# Patient Record
Sex: Female | Born: 1955
Health system: Southern US, Community
[De-identification: ages and names within clinical notes are randomized; demographics above are authoritative.]

## PROBLEM LIST (undated history)

## (undated) DIAGNOSIS — T7840XA Allergy, unspecified, initial encounter: Secondary | ICD-10-CM

## (undated) DIAGNOSIS — E079 Disorder of thyroid, unspecified: Secondary | ICD-10-CM

## (undated) DIAGNOSIS — E785 Hyperlipidemia, unspecified: Secondary | ICD-10-CM

## (undated) DIAGNOSIS — E559 Vitamin D deficiency, unspecified: Secondary | ICD-10-CM

## (undated) DIAGNOSIS — R7303 Prediabetes: Secondary | ICD-10-CM

## (undated) DIAGNOSIS — E039 Hypothyroidism, unspecified: Secondary | ICD-10-CM

## (undated) DIAGNOSIS — K589 Irritable bowel syndrome without diarrhea: Secondary | ICD-10-CM

## (undated) DIAGNOSIS — E119 Type 2 diabetes mellitus without complications: Secondary | ICD-10-CM

## (undated) HISTORY — PX: FACIAL COSMETIC SURGERY: SHX629

## (undated) HISTORY — DX: Type 2 diabetes mellitus without complications: E11.9

## (undated) HISTORY — PX: BREAST ENHANCEMENT SURGERY: SHX7

## (undated) HISTORY — PX: SHOULDER ARTHROSCOPY: SHX128

## (undated) HISTORY — DX: Allergy, unspecified, initial encounter: T78.40XA

## (undated) HISTORY — PX: TONSILLECTOMY: SUR1361

## (undated) HISTORY — PX: COSMETIC SURGERY: SHX468

## (undated) HISTORY — PX: TUBAL LIGATION: SHX77

---
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
## 1977-01-23 HISTORY — PX: AUGMENTATION MAMMAPLASTY: SUR837

## 1997-06-17 ENCOUNTER — Other Ambulatory Visit: Admission: RE | Admit: 1997-06-17 | Discharge: 1997-06-17 | Payer: Self-pay | Admitting: Obstetrics and Gynecology

## 1997-12-16 ENCOUNTER — Other Ambulatory Visit: Admission: RE | Admit: 1997-12-16 | Discharge: 1997-12-16 | Payer: Self-pay | Admitting: Obstetrics and Gynecology

## 1998-07-14 ENCOUNTER — Other Ambulatory Visit: Admission: RE | Admit: 1998-07-14 | Discharge: 1998-07-14 | Payer: Self-pay | Admitting: Obstetrics and Gynecology

## 1999-08-08 ENCOUNTER — Other Ambulatory Visit: Admission: RE | Admit: 1999-08-08 | Discharge: 1999-08-08 | Payer: Self-pay | Admitting: Obstetrics and Gynecology

## 1999-08-19 ENCOUNTER — Encounter: Payer: Self-pay | Admitting: Internal Medicine

## 1999-08-19 ENCOUNTER — Encounter: Admission: RE | Admit: 1999-08-19 | Discharge: 1999-08-19 | Payer: Self-pay | Admitting: Internal Medicine

## 1999-11-22 ENCOUNTER — Encounter: Admission: RE | Admit: 1999-11-22 | Discharge: 1999-11-22 | Payer: Self-pay | Admitting: Obstetrics and Gynecology

## 1999-11-22 ENCOUNTER — Encounter: Payer: Self-pay | Admitting: Obstetrics and Gynecology

## 2000-09-06 ENCOUNTER — Other Ambulatory Visit: Admission: RE | Admit: 2000-09-06 | Discharge: 2000-09-06 | Payer: Self-pay | Admitting: Obstetrics and Gynecology

## 2000-10-25 ENCOUNTER — Ambulatory Visit (HOSPITAL_COMMUNITY): Admission: RE | Admit: 2000-10-25 | Discharge: 2000-10-25 | Payer: Self-pay | Admitting: Obstetrics and Gynecology

## 2000-10-25 ENCOUNTER — Encounter (INDEPENDENT_AMBULATORY_CARE_PROVIDER_SITE_OTHER): Payer: Self-pay

## 2001-06-12 ENCOUNTER — Encounter: Payer: Self-pay | Admitting: Obstetrics and Gynecology

## 2001-06-12 ENCOUNTER — Encounter: Admission: RE | Admit: 2001-06-12 | Discharge: 2001-06-12 | Payer: Self-pay | Admitting: Obstetrics and Gynecology

## 2001-09-25 ENCOUNTER — Other Ambulatory Visit: Admission: RE | Admit: 2001-09-25 | Discharge: 2001-09-25 | Payer: Self-pay | Admitting: Obstetrics and Gynecology

## 2002-09-23 ENCOUNTER — Encounter: Payer: Self-pay | Admitting: Internal Medicine

## 2002-09-23 ENCOUNTER — Encounter: Admission: RE | Admit: 2002-09-23 | Discharge: 2002-09-23 | Payer: Self-pay | Admitting: Internal Medicine

## 2003-04-09 ENCOUNTER — Other Ambulatory Visit: Admission: RE | Admit: 2003-04-09 | Discharge: 2003-04-09 | Payer: Self-pay | Admitting: Obstetrics and Gynecology

## 2004-04-28 ENCOUNTER — Other Ambulatory Visit: Admission: RE | Admit: 2004-04-28 | Discharge: 2004-04-28 | Payer: Self-pay | Admitting: Obstetrics and Gynecology

## 2005-04-07 ENCOUNTER — Ambulatory Visit: Payer: Self-pay | Admitting: Gastroenterology

## 2005-04-17 ENCOUNTER — Ambulatory Visit: Payer: Self-pay | Admitting: Gastroenterology

## 2005-04-19 ENCOUNTER — Ambulatory Visit: Payer: Self-pay | Admitting: Gastroenterology

## 2005-10-20 ENCOUNTER — Other Ambulatory Visit: Admission: RE | Admit: 2005-10-20 | Discharge: 2005-10-20 | Payer: Self-pay | Admitting: Internal Medicine

## 2005-10-24 ENCOUNTER — Encounter: Admission: RE | Admit: 2005-10-24 | Discharge: 2005-10-24 | Payer: Self-pay | Admitting: Internal Medicine

## 2006-03-07 ENCOUNTER — Encounter: Admission: RE | Admit: 2006-03-07 | Discharge: 2006-03-07 | Payer: Self-pay | Admitting: Internal Medicine

## 2006-07-26 ENCOUNTER — Encounter: Admission: RE | Admit: 2006-07-26 | Discharge: 2006-07-26 | Payer: Self-pay | Admitting: Internal Medicine

## 2007-06-06 ENCOUNTER — Encounter: Admission: RE | Admit: 2007-06-06 | Discharge: 2007-06-06 | Payer: Self-pay | Admitting: Internal Medicine

## 2008-08-20 ENCOUNTER — Encounter: Admission: RE | Admit: 2008-08-20 | Discharge: 2008-08-20 | Payer: Self-pay | Admitting: Internal Medicine

## 2010-01-19 ENCOUNTER — Encounter
Admission: RE | Admit: 2010-01-19 | Discharge: 2010-01-19 | Payer: Self-pay | Source: Home / Self Care | Attending: Internal Medicine | Admitting: Internal Medicine

## 2010-01-21 ENCOUNTER — Encounter: Payer: Self-pay | Admitting: Cardiovascular Disease

## 2010-01-21 ENCOUNTER — Ambulatory Visit: Admission: RE | Admit: 2010-01-21 | Discharge: 2010-01-21 | Payer: Self-pay | Source: Home / Self Care

## 2010-01-21 DIAGNOSIS — I6350 Cerebral infarction due to unspecified occlusion or stenosis of unspecified cerebral artery: Secondary | ICD-10-CM | POA: Insufficient documentation

## 2010-01-28 ENCOUNTER — Ambulatory Visit: Admission: RE | Admit: 2010-01-28 | Discharge: 2010-01-28 | Payer: Self-pay | Source: Home / Self Care

## 2010-01-28 ENCOUNTER — Ambulatory Visit (HOSPITAL_COMMUNITY)
Admission: RE | Admit: 2010-01-28 | Discharge: 2010-01-28 | Payer: Self-pay | Source: Home / Self Care | Attending: Diagnostic Neuroimaging | Admitting: Diagnostic Neuroimaging

## 2010-01-28 ENCOUNTER — Other Ambulatory Visit: Payer: Self-pay | Admitting: Diagnostic Neuroimaging

## 2010-01-31 ENCOUNTER — Encounter: Payer: Self-pay | Admitting: Internal Medicine

## 2010-02-24 NOTE — Miscellaneous (Signed)
Summary: Orders Update  Clinical Lists Changes  Problems: Added new problem of CVA (ICD-434.91) Orders: Added new Test order of Carotid Duplex (Carotid Duplex) - Signed 

## 2010-06-10 NOTE — Op Note (Signed)
[facility]  Patient:    Jennifer Herrera, Jennifer Herrera Visit Number: [id number] MRN: [id number]          Service Type: DSU Location: [facility] Attending Physician:  Frederich Balding Proc. Date: 10/25/00 Admit Date:  10/25/2000                             Operative Report  PREOPERATIVE DIAGNOSIS:       Abnormal uterine bleeding with endometrial polyp.  POSTOPERATIVE DIAGNOSIS:      Abnormal uterine bleeding with endometrial polyp.  OPERATION:                    Cervical dilation.  Hysteroscopy with resection of endometrial polyps and multiple endometrial biopsies.  Intrauterine curettings.  SURGEON:                      Juluis Mire, M.D.  ASSISTANT:  ANESTHESIA:                   MAC with paracervical block.  ESTIMATED BLOOD LOSS:         Minimal.  PACKS AND DRAINS:             None.  BLOOD REPLACED:               None.  COMPLICATIONS:                None.  INDICATIONS:                  Dictated in history and physical.  DESCRIPTION OF PROCEDURE:     The patient was taken to the OR and placed in the supine position.  After slight sedation, she was placed in the dorsal lithotomy position using Allen stirrups.  The patient was draped out for laparoscopy.  A speculum was placed in the vaginal vault.  The cervix and vagina were cleansed with Betadine.  The cervix was grasped with a single tooth tenaculum.  A paracervical block was instituted using 1% Xylocaine. Hysteroscope was then introduced and intrauterine cavity was distended using Sorbitol.  Visualization revealed a polyp that was free.  This was removed. She had a second polyp up in the left uterine fundus that was excised using a resectoscope.  At this point in time multiple endometrial biopsies were obtained with the resectoscope.  No active bleeding or perforation was noted. Endometrial curettings were also obtained and sent for pathology.  At the end of the procedure, there was no active  bleeding and our deficit was 0.  The speculum and single tooth tenaculum then removed.  The patient was taken out of the dorsal lithotomy position and once alert, transferred to the recovery room in good condition. Attending Physician:  Frederich Balding DD:  10/25/00 TD:  10/25/00 Job: [id number]

## 2010-06-10 NOTE — H&P (Signed)
[facility]  Patient:    Jennifer Herrera, Jennifer Herrera Visit Number: [id number] MRN: [id number]          Service Type: Attending:  Juluis Mire, M.D. Dictated by:   Juluis Mire, M.D. Adm. Date:  10/25/00                           History and Physical  REASON FOR ADMISSION:         This 55 year old gravida 2, para 2 married white female presents for hysteroscopy with resectoscope.  HISTORY OF PRESENT ILLNESS:   The patient has a history of menstrual irregularities.  The patient had begun on low-dose birth control pills for management.  She subsequently discontinued these and continued to have abnormal bleeding.  The patient underwent saline infusion ultrasound that revealed endometrial irregularity with possible polyps.  In view of this, she presents for possible hysteroscopic evaluation and resection.  Of note, she did have lab work, which included a normal FHS, thyroid and prolactin.  ALLERGIES:                    Sulfa.  MEDICATIONS:                  She had been on estradiol.  PAST MEDICAL HISTORY:         Usual childhood diseases.  No significant sequelae.  She has had two prior laparoscopies with findings of endometriosis.  Subsequent bilateral tubal ligation in 1993 was negative.  She does have bilateral breast implants.  PAST OBSTETRIC HISTORY:       Two spontaneous vaginal deliveries.  FAMILY HISTORY:               Noncontributory.  SOCIAL HISTORY:               No tobacco or alcohol use.  REVIEW OF SYSTEMS:            Noncontributory.  PHYSICAL EXAMINATION:  VITAL SIGNS:                  Afebrile with stable vital signs.  HEENT:                        Normocephalic.  Pupils equal, round and reactive to light and accommodation.  Extraocular movements intact.  Sclerae and conjunctivae clear.  Oropharynx clear.  NECK:                         No thyromegaly.  BREASTS:                      Bilateral implants were noted.  No  dominant masses.  LUNGS:                        Clear.  CARDIAC:                      ______.  No murmurs or gallops.  ABDOMEN:                      Benign.  PELVIC:                       Normal external genitalia.  The vaginal mucosa is clear.  Cervix unremarkable.  The uterus is normal size,  shape and contour. Adnexa free of masses and tenderness.  EXTREMITIES:                  Trace edema.  NEUROLOGIC:                   Grossly within normal limits.  IMPRESSION:                   Abnormal uterine bleeding with questionable endometrial polyp.  PLAN:                         The patient will undergo a hysteroscopic evaluation with resection.  The risks of surgery have been discussed including the risk of infection.  The risk of vascular injury that could lead to the need for transfusion or hysterectomy.  The risk of uterine perforation that could lead to injury to adjacent organs requiring laparoscopy and possible exploratory laparotomy for management.  The risk of deep venous thrombosis and pulmonary embolus.  Excessive resection can lead to possible pulmonary edema and hyponatremia.  The patient understands the risks and indications. Dictated by:   Juluis Mire, M.D. Attending:  Juluis Mire, M.D. DD:  10/25/00 TD:  10/25/00 Job: [id number]

## 2011-06-27 ENCOUNTER — Other Ambulatory Visit: Payer: Self-pay | Admitting: Internal Medicine

## 2011-06-27 DIAGNOSIS — K7689 Other specified diseases of liver: Secondary | ICD-10-CM

## 2011-06-29 ENCOUNTER — Ambulatory Visit
Admission: RE | Admit: 2011-06-29 | Discharge: 2011-06-29 | Disposition: A | Discharge status: Home / Self Care | Payer: 59 | Source: Ambulatory Visit | Attending: Internal Medicine | Admitting: Internal Medicine

## 2011-06-29 DIAGNOSIS — K7689 Other specified diseases of liver: Secondary | ICD-10-CM

## 2012-10-25 ENCOUNTER — Other Ambulatory Visit: Payer: Self-pay | Admitting: Internal Medicine

## 2012-10-25 DIAGNOSIS — Z1231 Encounter for screening mammogram for malignant neoplasm of breast: Secondary | ICD-10-CM

## 2012-11-27 ENCOUNTER — Ambulatory Visit
Admission: RE | Admit: 2012-11-27 | Discharge: 2012-11-27 | Disposition: A | Discharge status: Home / Self Care | Payer: 59 | Source: Ambulatory Visit | Attending: Internal Medicine | Admitting: Internal Medicine

## 2012-11-27 DIAGNOSIS — Z1231 Encounter for screening mammogram for malignant neoplasm of breast: Secondary | ICD-10-CM

## 2012-11-29 ENCOUNTER — Other Ambulatory Visit: Payer: Self-pay | Admitting: Internal Medicine

## 2012-11-29 DIAGNOSIS — R928 Other abnormal and inconclusive findings on diagnostic imaging of breast: Secondary | ICD-10-CM

## 2012-12-06 ENCOUNTER — Other Ambulatory Visit: Payer: 59

## 2012-12-10 ENCOUNTER — Ambulatory Visit
Admission: RE | Admit: 2012-12-10 | Discharge: 2012-12-10 | Disposition: A | Discharge status: Home / Self Care | Payer: 59 | Source: Ambulatory Visit | Attending: Internal Medicine | Admitting: Internal Medicine

## 2012-12-10 DIAGNOSIS — R928 Other abnormal and inconclusive findings on diagnostic imaging of breast: Secondary | ICD-10-CM

## 2014-04-15 ENCOUNTER — Ambulatory Visit
Admission: RE | Admit: 2014-04-15 | Discharge: 2014-04-15 | Disposition: A | Discharge status: Home / Self Care | Payer: Managed Care, Other (non HMO) | Source: Ambulatory Visit | Attending: Internal Medicine | Admitting: Internal Medicine

## 2014-04-15 ENCOUNTER — Observation Stay (HOSPITAL_COMMUNITY)
Admission: EM | Admit: 2014-04-15 | Discharge: 2014-04-16 | Disposition: A | Discharge status: Home / Self Care | Payer: Managed Care, Other (non HMO) | Attending: General Surgery | Admitting: General Surgery

## 2014-04-15 ENCOUNTER — Other Ambulatory Visit: Payer: Self-pay | Admitting: Internal Medicine

## 2014-04-15 ENCOUNTER — Encounter (HOSPITAL_COMMUNITY): Payer: Self-pay | Admitting: Emergency Medicine

## 2014-04-15 DIAGNOSIS — Z8673 Personal history of transient ischemic attack (TIA), and cerebral infarction without residual deficits: Secondary | ICD-10-CM | POA: Insufficient documentation

## 2014-04-15 DIAGNOSIS — Z79899 Other long term (current) drug therapy: Secondary | ICD-10-CM | POA: Insufficient documentation

## 2014-04-15 DIAGNOSIS — K589 Irritable bowel syndrome without diarrhea: Secondary | ICD-10-CM | POA: Diagnosis not present

## 2014-04-15 DIAGNOSIS — K5909 Other constipation: Secondary | ICD-10-CM | POA: Diagnosis present

## 2014-04-15 DIAGNOSIS — R1031 Right lower quadrant pain: Secondary | ICD-10-CM | POA: Diagnosis not present

## 2014-04-15 DIAGNOSIS — Z882 Allergy status to sulfonamides status: Secondary | ICD-10-CM | POA: Insufficient documentation

## 2014-04-15 DIAGNOSIS — R1084 Generalized abdominal pain: Secondary | ICD-10-CM

## 2014-04-15 DIAGNOSIS — E039 Hypothyroidism, unspecified: Secondary | ICD-10-CM | POA: Insufficient documentation

## 2014-04-15 DIAGNOSIS — Q438 Other specified congenital malformations of intestine: Secondary | ICD-10-CM

## 2014-04-15 DIAGNOSIS — K5900 Constipation, unspecified: Secondary | ICD-10-CM | POA: Insufficient documentation

## 2014-04-15 HISTORY — DX: Disorder of thyroid, unspecified: E07.9

## 2014-04-15 LAB — CBC WITH DIFFERENTIAL/PLATELET
Basophils Absolute: 0 10*3/uL (ref 0.0–0.1)
Basophils Relative: 0 % (ref 0–1)
EOS ABS: 0.1 10*3/uL (ref 0.0–0.7)
EOS PCT: 1 % (ref 0–5)
HEMATOCRIT: 44.4 % (ref 36.0–46.0)
HEMOGLOBIN: 15 g/dL (ref 12.0–15.0)
LYMPHS ABS: 3.2 10*3/uL (ref 0.7–4.0)
Lymphocytes Relative: 37 % (ref 12–46)
MCH: 31.5 pg (ref 26.0–34.0)
MCHC: 33.8 g/dL (ref 30.0–36.0)
MCV: 93.3 fL (ref 78.0–100.0)
MONOS PCT: 7 % (ref 3–12)
Monocytes Absolute: 0.6 10*3/uL (ref 0.1–1.0)
NEUTROS PCT: 55 % (ref 43–77)
Neutro Abs: 4.7 10*3/uL (ref 1.7–7.7)
Platelets: 295 10*3/uL (ref 150–400)
RBC: 4.76 MIL/uL (ref 3.87–5.11)
RDW: 13.4 % (ref 11.5–15.5)
WBC: 8.6 10*3/uL (ref 4.0–10.5)

## 2014-04-15 LAB — URINALYSIS, ROUTINE W REFLEX MICROSCOPIC
Bilirubin Urine: NEGATIVE
Glucose, UA: NEGATIVE mg/dL
HGB URINE DIPSTICK: NEGATIVE
KETONES UR: NEGATIVE mg/dL
LEUKOCYTES UA: NEGATIVE
Nitrite: NEGATIVE
PROTEIN: NEGATIVE mg/dL
Specific Gravity, Urine: 1.022 (ref 1.005–1.030)
UROBILINOGEN UA: 1 mg/dL (ref 0.0–1.0)
pH: 7 (ref 5.0–8.0)

## 2014-04-15 LAB — I-STAT CHEM 8, ED
BUN: 19 mg/dL (ref 6–23)
CALCIUM ION: 1.22 mmol/L (ref 1.12–1.23)
CHLORIDE: 101 mmol/L (ref 96–112)
Creatinine, Ser: 0.8 mg/dL (ref 0.50–1.10)
GLUCOSE: 94 mg/dL (ref 70–99)
HEMATOCRIT: 48 % — AB (ref 36.0–46.0)
Hemoglobin: 16.3 g/dL — ABNORMAL HIGH (ref 12.0–15.0)
Potassium: 3.7 mmol/L (ref 3.5–5.1)
Sodium: 138 mmol/L (ref 135–145)
TCO2: 20 mmol/L (ref 0–100)

## 2014-04-15 MED ORDER — DIPHENHYDRAMINE HCL 50 MG/ML IJ SOLN: 12.5000 mg | Freq: Four times a day (QID) | INTRAMUSCULAR | Status: DC | PRN

## 2014-04-15 MED ORDER — TOPIRAMATE 25 MG PO TABS
50.0000 mg | ORAL_TABLET | Freq: Every day | ORAL | Status: DC
  Administered 2014-04-16: 50 mg via ORAL
  Filled 2014-04-15: qty 2

## 2014-04-15 MED ORDER — DIPHENHYDRAMINE HCL 12.5 MG/5ML PO ELIX: 12.5000 mg | ORAL_SOLUTION | Freq: Four times a day (QID) | ORAL | Status: DC | PRN

## 2014-04-15 MED ORDER — HYDROCODONE-ACETAMINOPHEN 5-325 MG PO TABS: 1.0000 | ORAL_TABLET | ORAL | Status: DC | PRN

## 2014-04-15 MED ORDER — DOCUSATE SODIUM 100 MG PO CAPS
100.0000 mg | ORAL_CAPSULE | Freq: Two times a day (BID) | ORAL | Status: DC
  Administered 2014-04-15 – 2014-04-16 (×2): 100 mg via ORAL
  Filled 2014-04-15 (×2): qty 1

## 2014-04-15 MED ORDER — TOPIRAMATE 25 MG PO TABS: 50.0000 mg | ORAL_TABLET | Freq: Every day | ORAL | Status: DC

## 2014-04-15 MED ORDER — ONDANSETRON HCL 4 MG/2ML IJ SOLN: 4.0000 mg | Freq: Four times a day (QID) | INTRAMUSCULAR | Status: DC | PRN

## 2014-04-15 MED ORDER — PEG 3350-KCL-NA BICARB-NACL 420 G PO SOLR
500.0000 mL | Freq: Three times a day (TID) | ORAL | Status: DC
  Administered 2014-04-15 – 2014-04-16 (×2): 500 mL via ORAL
  Filled 2014-04-15: qty 4000

## 2014-04-15 MED ORDER — LEVOTHYROXINE SODIUM 88 MCG PO TABS
88.0000 ug | ORAL_TABLET | Freq: Every day | ORAL | Status: DC
  Administered 2014-04-16: 88 ug via ORAL
  Filled 2014-04-15: qty 1

## 2014-04-15 MED ORDER — MORPHINE SULFATE 2 MG/ML IJ SOLN: 1.0000 mg | INTRAMUSCULAR | Status: DC | PRN

## 2014-04-15 MED ORDER — IOPAMIDOL (ISOVUE-300) INJECTION 61%
100.0000 mL | Freq: Once | INTRAVENOUS | Status: AC | PRN
  Administered 2014-04-15: 100 mL via INTRAVENOUS

## 2014-04-15 MED ORDER — ACETAMINOPHEN 325 MG PO TABS: 650.0000 mg | ORAL_TABLET | Freq: Four times a day (QID) | ORAL | Status: DC | PRN

## 2014-04-15 MED ORDER — VALACYCLOVIR HCL 500 MG PO TABS
500.0000 mg | ORAL_TABLET | Freq: Two times a day (BID) | ORAL | Status: DC
  Administered 2014-04-15 – 2014-04-16 (×2): 500 mg via ORAL
  Filled 2014-04-15 (×4): qty 1

## 2014-04-15 MED ORDER — LIOTHYRONINE SODIUM 25 MCG PO TABS
25.0000 ug | ORAL_TABLET | Freq: Every day | ORAL | Status: DC
  Administered 2014-04-16: 25 ug via ORAL
  Filled 2014-04-15: qty 1

## 2014-04-15 MED ORDER — ACETAMINOPHEN 650 MG RE SUPP: 650.0000 mg | Freq: Four times a day (QID) | RECTAL | Status: DC | PRN

## 2014-04-15 NOTE — H&P (Addendum)
Jennifer Herrera is an 59 y.o. female.   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 This started out as generalized abdominal pain. She does have irritable bowel syndrome and felt that she just had a lot of gas. She took a lot of Gas-X but this did not relieve her discomfort. She felt like she was "8 months pregnant". She had to hold her abdomen to make it feel better. She did not have nausea or vomiting during this time. She usually has constipation with her IBS but did have some diarrhea approximately 4-5 days ago. She denies fevers and chills.  In the last 24 hours, she has had worsening pain and migration of the pain to the right lower quadrant.  She states that walking made the pain worse. She also felt that sitting when the pain worse. She has had occasional night sweats, but no change from her normal menopause related night sweats.  She reports that the pain is around 6-7 out of 10.  She also had some significant chest pain/heartburn several days ago. This was bad enough that she consider stopping and getting an aspirin.  Past Medical History  Diagnosis Date  . Thyroid disease     Past Surgical History  Procedure Laterality Date  . Breast enhancement surgery    . Shoulder arthroscopy    . Tonsillectomy      No family history on file. Social History:  reports that she has never smoked. She does not have any smokeless tobacco history on file. She reports that she drinks alcohol. She reports that she does not use illicit drugs.  Allergies:  Allergies  Allergen Reactions  . Sulfa Antibiotics Rash   Medications: Medications Hospital Medications (13) Outpatient Medications (8) Clinic-Administered Medications (0)   New medications from outside sources are available for reconciliation  L1 acetaminophen (TYLENOL) suppository 650 mg   L1 acetaminophen (TYLENOL) tablet 650 mg   L2 diphenhydrAMINE  (BENADRYL) 12.5 MG/5ML elixir 12.5-25 mg   L2 diphenhydrAMINE (BENADRYL) injection 12.5-25 mg    docusate sodium (COLACE) capsule 100 mg    HYDROcodone-acetaminophen (NORCO/VICODIN) 5-325 MG per tablet 1-2 tablet    levothyroxine (SYNTHROID, LEVOTHROID) tablet 88 mcg    liothyronine (CYTOMEL) tablet 25 mcg    morphine 2 MG/ML injection 1-2 mg    ondansetron (ZOFRAN) injection 4 mg    polyethylene glycol-electrolytes (NuLYTELY/GoLYTELY) solution 500 mL    topiramate (TOPAMAX) tablet 50 mg    valACYclovir (VALTREX) tablet 500 mg           Results for orders placed or performed during the hospital encounter of 04/15/14 (from the past 48 hour(s))  CBC with Differential     Status: None   Collection Time: 04/15/14  5:25 PM  Result Value Ref Range   WBC 8.6 4.0 - 10.5 K/uL   RBC 4.76 3.87 - 5.11 MIL/uL   Hemoglobin 15.0 12.0 - 15.0 g/dL   HCT 16.1 09.6 - 04.5 %   MCV 93.3 78.0 - 100.0 fL   MCH 31.5 26.0 - 34.0 pg   MCHC 33.8 30.0 - 36.0 g/dL   RDW 40.9 81.1 - 91.4 %   Platelets 295 150 - 400 K/uL   Neutrophils Relative % 55 43 - 77 %   Neutro Abs 4.7 1.7 - 7.7 K/uL   Lymphocytes Relative 37 12 - 46 %   Lymphs Abs 3.2 0.7 - 4.0 K/uL   Monocytes  Relative 7 3 - 12 %   Monocytes Absolute 0.6 0.1 - 1.0 K/uL   Eosinophils Relative 1 0 - 5 %   Eosinophils Absolute 0.1 0.0 - 0.7 K/uL   Basophils Relative 0 0 - 1 %   Basophils Absolute 0.0 0.0 - 0.1 K/uL  I-Stat Chem 8, ED     Status: Abnormal   Collection Time: 04/15/14  5:34 PM  Result Value Ref Range   Sodium 138 135 - 145 mmol/L   Potassium 3.7 3.5 - 5.1 mmol/L   Chloride 101 96 - 112 mmol/L   BUN 19 6 - 23 mg/dL   Creatinine, Ser 4.09 0.50 - 1.10 mg/dL   Glucose, Bld 94 70 - 99 mg/dL   Calcium, Ion 8.11 9.14 - 1.23 mmol/L   TCO2 20 0 - 100 mmol/L   Hemoglobin 16.3 (H) 12.0 - 15.0 g/dL   HCT 78.2 (H) 95.6 - 21.3 %  Urinalysis, Routine w reflex microscopic     Status: None   Collection Time: 04/15/14  7:36 PM    Result Value Ref Range   Color, Urine YELLOW YELLOW   APPearance CLEAR CLEAR   Specific Gravity, Urine 1.022 1.005 - 1.030   pH 7.0 5.0 - 8.0   Glucose, UA NEGATIVE NEGATIVE mg/dL   Hgb urine dipstick NEGATIVE NEGATIVE   Bilirubin Urine NEGATIVE NEGATIVE   Ketones, ur NEGATIVE NEGATIVE mg/dL   Protein, ur NEGATIVE NEGATIVE mg/dL   Urobilinogen, UA 1.0 0.0 - 1.0 mg/dL   Nitrite NEGATIVE NEGATIVE   Leukocytes, UA NEGATIVE NEGATIVE    Comment: MICROSCOPIC NOT DONE ON URINES WITH NEGATIVE PROTEIN, BLOOD, LEUKOCYTES, NITRITE, OR GLUCOSE <1000 mg/dL.   Ct Abdomen Pelvis W Contrast  04/15/2014   CLINICAL DATA:  Right lower quadrant pain for 3 days, diarrhea, history of irritable bowel syndrome  EXAM: CT ABDOMEN AND PELVIS WITH CONTRAST  TECHNIQUE: Multidetector CT imaging of the abdomen and pelvis was performed using the standard protocol following bolus administration of intravenous contrast.  CONTRAST:  100 cc Isovue-300  COMPARISON:  Ultrasound of the abdomen of 06/29/2011  FINDINGS: The lung bases are clear. Calcified bilateral breast implants are noted. The liver enhances with no focal abnormality and no ductal dilatation is seen. The gallbladder is not distended and no calcified gallstones are seen. The pancreas is normal in size and the pancreatic duct is not dilated. The adrenal glands and spleen are unremarkable. The stomach is moderately fluid distended. There is some thickening of the mucosa of the distal antrum -duodenal bulb of questionable significance. The kidneys enhance and no renal calculi are seen. There is no evidence of hydronephrosis. The abdominal aorta is normal in caliber. No adenopathy is seen.  The appendix is visualized retrocecal in position within the right lower quadrant -right upper pelvis. The proximal portion of the appendix appears normal. However the distal portion appears somewhat edematous although there is air within the lumen. Within the distal portion of the  appendix, the diameter is approximately 10 mm which is abnormal. Developing appendicitis cannot be excluded. The terminal ileum is unremarkable.  The urinary bladder is moderately distended with no abnormality noted. The uterus is normal in size. No adnexal lesion is seen, and no free fluid is seen within the pelvis. No abnormality of the colon is seen. The lumbar vertebrae are in normal alignment. There is mild degenerative disc disease at L5-S1.  IMPRESSION: 1. Somewhat thick-walled appendix near the tip measuring up to 10 mm in diameter although there  is air present within the lumen. Developing acute appendicitis cannot be excluded. 2. Somewhat thickened mucosa of the distal antrum of the stomach -duodenal bulb of uncertain significance. This may well be normal but clinical correlation is recommended. 3. Mild degenerative disc disease at L5-S1.   Electronically Signed   By: Dwyane DeePaul  Barry M.D.   On: 04/15/2014 16:19    Review of Systems  Constitutional: Negative for fever, chills and malaise/fatigue.  HENT: Negative.   Eyes: Negative.   Respiratory: Negative.   Cardiovascular: Positive for chest pain.  Gastrointestinal: Positive for heartburn (2 days ago.  ), abdominal pain, diarrhea and constipation. Negative for blood in stool.  Genitourinary: Negative.   Musculoskeletal: Negative.   Skin: Negative.   Endo/Heme/Allergies: Negative.   Psychiatric/Behavioral: Negative.   All other systems reviewed and are negative.   Blood pressure 105/60, pulse 70, temperature 98.2 F (36.8 C), temperature source Oral, resp. rate 16, height 5' 5.98" (1.676 m), weight 69.99 kg (154 lb 4.8 oz), SpO2 96 %. Physical Exam  Constitutional: She is oriented to person, place, and time. She appears well-developed and well-nourished. No distress.  HENT:  Head: Normocephalic and atraumatic.  Eyes: Conjunctivae are normal. Pupils are equal, round, and reactive to light. Right eye exhibits no discharge. Left eye exhibits  no discharge. No scleral icterus.  Neck: Normal range of motion. Neck supple.  Cardiovascular: Normal rate.   Respiratory: Effort normal. No respiratory distress.  GI: Soft. She exhibits no distension. There is no tenderness. There is no rebound.  Musculoskeletal: Normal range of motion. She exhibits no edema or tenderness.  Neurological: She is alert and oriented to person, place, and time.  Skin: Skin is warm and dry. No rash noted. She is not diaphoretic. No erythema. No pallor.  Psychiatric: She has a normal mood and affect. Her behavior is normal. Judgment and thought content normal.     Assessment/Plan Right lower quadrant pain.  The patient does have slight dilation of her appendiceal tip on CT scan. However, she does not have the other signs and symptoms of appendicitis. There is air in the tip of the appendix as well as no surrounding stranding. She does not have right lower quadrant tenderness or guarding, leukocytosis, left shift, or fevers. With 48 hours of pain, I would expect to see some more positive signs of appendicitis.  I do think she looks like she is quite constipated based on the appearance of stool all the way to her right colon. She has an extremely redundant transverse colon. I think this is likely the cause of her pain. However, given the fact that she describes the pain as different from her previous IBS flares, I do think it's reasonable to admit her and reexamine her in the morning. I'll communicate this with Dr. Derrell Lollingamirez. Art Levan 04/15/2014, 10:39 PM

## 2014-04-15 NOTE — ED Provider Notes (Signed)
CSN: [id number]     Arrival date & time 04/15/14  1654 History   First MD Initiated Contact with Patient 04/15/14 1802     Chief Complaint  Patient presents with  . Abdominal Pain     (Consider location/radiation/quality/duration/timing/severity/associated sxs/prior Treatment) HPI Comments: Jennifer Herrera is a 59 y.o. female with a PMHx of hypothyroidism and IBS, who presents to the ED with complaints of gradual onset abdominal pain 3 days. She reports that the pain began as a dull generalized pain gradually localized into the right lower quadrant. She saw Dr. Eula Listen at [facility], who sent her for a CT scan which showed early appendicitis. She states the pain is 3/10 at this time, constant, waxing and waning in severity, dull and intermittently sharp, nonradiating, worse with movement or walking, and improved with laying flat stretched out on her back, and staying still. She also reports some relief with ibuprofen. She reports that she has had some constipation since Saturday, stating that this is normal for her with her IBS. She denies any fevers, chills, chest pain, shortness of breath, nausea, vomiting, diarrhea, constipation, rectal pain or bleeding, dysuria, hematuria, vaginal bleeding or discharge, arthralgias, myalgias, rashes, numbness, tingling, weakness. Denies any sick contacts, recent travel, suspicious food intake, or alcohol use. Last meal was a protein bar 12:30 PM. Patient is allergic to sulfa medications.  Patient is a 59 y.o. female presenting with abdominal pain. The history is provided by the patient. No language interpreter was used.  Abdominal Pain Pain location:  RLQ Pain quality: dull and sharp   Pain radiates to:  Does not radiate Pain severity:  Moderate Onset quality:  Gradual Duration:  3 days Timing:  Constant Progression:  Worsening Chronicity:  New Context: not recent travel, not sick contacts, not suspicious food intake and not trauma   Relieved  by:  Lying down (staying still and laying flat, stretched out) Worsened by:  Movement and NSAIDs Ineffective treatments:  None tried Associated symptoms: constipation (normal for her)   Associated symptoms: no belching, no chest pain, no chills, no diarrhea, no dysuria, no fever, no flatus, no hematemesis, no hematochezia, no hematuria, no melena, no nausea, no shortness of breath, no vaginal bleeding, no vaginal discharge and no vomiting     Past Medical History  Diagnosis Date  . Thyroid disease    Past Surgical History  Procedure Laterality Date  . Breast enhancement surgery    . Shoulder arthroscopy    . Tonsillectomy     No family history on file. History  Substance Use Topics  . Smoking status: Never Smoker   . Smokeless tobacco: Not on file  . Alcohol Use: Yes     Comment: once weekly   OB History    No data available     Review of Systems  Constitutional: Negative for fever and chills.  Respiratory: Negative for shortness of breath.   Cardiovascular: Negative for chest pain.  Gastrointestinal: Positive for abdominal pain and constipation (normal for her). Negative for nausea, vomiting, diarrhea, melena, hematochezia, rectal pain, flatus and hematemesis.  Genitourinary: Negative for dysuria, hematuria, flank pain, vaginal bleeding and vaginal discharge.  Musculoskeletal: Negative for myalgias and arthralgias.  Skin: Negative for rash.  Allergic/Immunologic: Negative for immunocompromised state.  Neurological: Negative for weakness and numbness.  Psychiatric/Behavioral: Negative for confusion.   10 Systems reviewed and are negative for acute change except as noted in the HPI.    Allergies  Sulfa antibiotics  Home Medications  Prior to Admission medications   Medication Sig Start Date End Date Taking? Authorizing Provider  Cholecalciferol (VITAMIN D) 2000 UNITS CAPS Take 1 capsule by mouth daily.   Yes Historical Provider, MD  cyanocobalamin (,VITAMIN B-12,)  1000 MCG/ML injection Inject 1 mL into the muscle once a week. 03/31/14  Yes Historical Provider, MD  Hypromellose (ARTIFICIAL TEARS OP) Place 1 drop into both eyes daily as needed (dry eyes).   Yes Historical Provider, MD  levothyroxine (SYNTHROID, LEVOTHROID) 88 MCG tablet Take 88 mcg by mouth daily before breakfast.   Yes Historical Provider, MD  liothyronine (CYTOMEL) 25 MCG tablet Take 25 mcg by mouth daily.   Yes Historical Provider, MD  topiramate (TOPAMAX) 50 MG tablet Take 50 mg by mouth daily.   Yes Historical Provider, MD  valACYclovir (VALTREX) 500 MG tablet Take 500 mg by mouth 2 (two) times daily. 01/14/14  Yes Historical Provider, MD  VITAMIN A PO Take 1 tablet by mouth daily.   Yes Historical Provider, MD   BP 129/76 mmHg  Pulse 78  Temp(Src) 98.2 F (36.8 C) (Oral)  Resp 18  Ht  (1.676 m)  Wt 150 lb (68.04 kg)  BMI 24.22 kg/m2  SpO2 100% Physical Exam  Constitutional: She is oriented to person, place, and time. Vital signs are normal. She appears well-developed and well-nourished.  Non-toxic appearance. No distress.  Afebrile, nontoxic, NAD  HENT:  Head: Normocephalic and atraumatic.  Mouth/Throat: Oropharynx is clear and moist and mucous membranes are normal.  Eyes: Conjunctivae and EOM are normal. Right eye exhibits no discharge. Left eye exhibits no discharge.  Neck: Normal range of motion. Neck supple.  Cardiovascular: Normal rate, regular rhythm, normal heart sounds and intact distal pulses.  Exam reveals no gallop and no friction rub.   No murmur heard. Pulmonary/Chest: Effort normal and breath sounds normal. No respiratory distress. She has no decreased breath sounds. She has no wheezes. She has no rhonchi. She has no rales.  Abdominal: Soft. Normal appearance and bowel sounds are normal. She exhibits no distension. There is tenderness in the right lower quadrant. There is tenderness at McBurney's point. There is no rigidity, no rebound, no guarding, no CVA  tenderness and negative Murphy's sign.    Soft, nondistended, +BS throughout, with RLQ TTP, no r/g/r, neg murphy's, +mcburney's, no CVA TTP, +foot tap test, neg psoas sign  Musculoskeletal: Normal range of motion.  Neurological: She is alert and oriented to person, place, and time. She has normal strength. No sensory deficit.  Skin: Skin is warm, dry and intact. No rash noted.  Psychiatric: She has a normal mood and affect.  Nursing note and vitals reviewed.   ED Course  Procedures (including critical care time) Labs Review Labs Reviewed  I-STAT CHEM 8, ED - Abnormal; Notable for the following:    Hemoglobin 16.3 (*)    HCT 48.0 (*)    All other components within normal limits  CBC WITH DIFFERENTIAL/PLATELET    Imaging Review Ct Abdomen Pelvis W Contrast  04/15/2014   CLINICAL DATA:  Right lower quadrant pain for 3 days, diarrhea, history of irritable bowel syndrome  EXAM: CT ABDOMEN AND PELVIS WITH CONTRAST  TECHNIQUE: Multidetector CT imaging of the abdomen and pelvis was performed using the standard protocol following bolus administration of intravenous contrast.  CONTRAST:  100 cc Isovue-300  COMPARISON:  Ultrasound of the abdomen of 06/29/2011  FINDINGS: The lung bases are clear. Calcified bilateral breast implants are noted. The liver enhances with  no focal abnormality and no ductal dilatation is seen. The gallbladder is not distended and no calcified gallstones are seen. The pancreas is normal in size and the pancreatic duct is not dilated. The adrenal glands and spleen are unremarkable. The stomach is moderately fluid distended. There is some thickening of the mucosa of the distal antrum -duodenal bulb of questionable significance. The kidneys enhance and no renal calculi are seen. There is no evidence of hydronephrosis. The abdominal aorta is normal in caliber. No adenopathy is seen.  The appendix is visualized retrocecal in position within the right lower quadrant -right upper  pelvis. The proximal portion of the appendix appears normal. However the distal portion appears somewhat edematous although there is air within the lumen. Within the distal portion of the appendix, the diameter is approximately 10 mm which is abnormal. Developing appendicitis cannot be excluded. The terminal ileum is unremarkable.  The urinary bladder is moderately distended with no abnormality noted. The uterus is normal in size. No adnexal lesion is seen, and no free fluid is seen within the pelvis. No abnormality of the colon is seen. The lumbar vertebrae are in normal alignment. There is mild degenerative disc disease at L5-S1.  IMPRESSION: 1. Somewhat thick-walled appendix near the tip measuring up to 10 mm in diameter although there is air present within the lumen. Developing acute appendicitis cannot be excluded. 2. Somewhat thickened mucosa of the distal antrum of the stomach -duodenal bulb of uncertain significance. This may well be normal but clinical correlation is recommended. 3. Mild degenerative disc disease at L5-S1.   Electronically Signed   By: Dwyane DeePaul  Barry M.D.   On: 04/15/2014 16:19     EKG Interpretation None      MDM   Final diagnoses:  Right lower quadrant pain    59 y.o. female here with RLQ pain x3 days, initially generalized then localized to RLQ. On exam, McBurney's point tenderness, +foot tap test. CT obtained from her PCP showing thick-walled appendix which could indicate early appendicitis. Labs obtained here WNL. Pt declines pain meds at this time. Will consult [facility] for surgical management of likely appendicitis.  6:31 PM Dr. Donell BeersByerly returning page, will evaluate pt.   8:44 PM Dr. Donell BeersByerly here to see pt, will admit. Please see her note for further documentation of care. Of note, U/A clear.  BP 122/99 mmHg  Pulse 71  Temp(Src) 98.2 F (36.8 C) (Oral)  Resp 16  Ht 5\' 6"  (1.676 m)  Wt 150 lb (68.04 kg)  BMI 24.22 kg/m2  SpO2 100%     Dyamon Sosinski Camprubi-Soms,  PA-C 04/15/14 2109  Arby BarretteMarcy Pfeiffer, MD 04/15/14 2142

## 2014-04-15 NOTE — Progress Notes (Signed)
Received patient from ED and admitted to room 14.  Patient is AOx4, ambulatory, VS stable, and slight pain at RLQ but tolerable.

## 2014-04-15 NOTE — ED Notes (Signed)
Attempted report 

## 2014-04-15 NOTE — ED Notes (Signed)
Pt sent to ED ref positive CT scan for appendicitis.  Pt c/o abd pain x's 3 days

## 2014-04-16 DIAGNOSIS — Q438 Other specified congenital malformations of intestine: Secondary | ICD-10-CM

## 2014-04-16 DIAGNOSIS — K5909 Other constipation: Secondary | ICD-10-CM | POA: Diagnosis present

## 2014-04-16 LAB — BASIC METABOLIC PANEL
Anion gap: 9 (ref 5–15)
BUN: 16 mg/dL (ref 6–23)
CALCIUM: 9.5 mg/dL (ref 8.4–10.5)
CO2: 23 mmol/L (ref 19–32)
Chloride: 107 mmol/L (ref 96–112)
Creatinine, Ser: 0.93 mg/dL (ref 0.50–1.10)
GFR calc Af Amer: 76 mL/min — ABNORMAL LOW (ref >90)
GFR calc non Af Amer: 66 mL/min — ABNORMAL LOW (ref >90)
GLUCOSE: 97 mg/dL (ref 70–99)
Potassium: 3.9 mmol/L (ref 3.5–5.1)
SODIUM: 139 mmol/L (ref 135–145)

## 2014-04-16 LAB — CBC
HCT: 42.2 % (ref 36.0–46.0)
Hemoglobin: 14.2 g/dL (ref 12.0–15.0)
MCH: 31.3 pg (ref 26.0–34.0)
MCHC: 33.6 g/dL (ref 30.0–36.0)
MCV: 93.2 fL (ref 78.0–100.0)
PLATELETS: 267 10*3/uL (ref 150–400)
RBC: 4.53 MIL/uL (ref 3.87–5.11)
RDW: 13.6 % (ref 11.5–15.5)
WBC: 6.1 10*3/uL (ref 4.0–10.5)

## 2014-04-16 MED ORDER — DOCUSATE SODIUM 100 MG PO CAPS: 100.0000 mg | ORAL_CAPSULE | Freq: Two times a day (BID) | ORAL | Status: DC

## 2014-04-16 MED ORDER — CHLORHEXIDINE GLUCONATE 0.12 % MT SOLN
15.0000 mL | Freq: Two times a day (BID) | OROMUCOSAL | Status: DC
  Administered 2014-04-16: 15 mL via OROMUCOSAL
  Filled 2014-04-16: qty 15

## 2014-04-16 MED ORDER — BISACODYL 10 MG RE SUPP
10.0000 mg | Freq: Once | RECTAL | Status: AC
  Administered 2014-04-16: 10 mg via RECTAL
  Filled 2014-04-16: qty 1

## 2014-04-16 MED ORDER — BISACODYL 10 MG RE SUPP: 10.0000 mg | Freq: Every day | RECTAL | Status: DC | PRN

## 2014-04-16 MED ORDER — CETYLPYRIDINIUM CHLORIDE 0.05 % MT LIQD
7.0000 mL | Freq: Two times a day (BID) | OROMUCOSAL | Status: DC
  Administered 2014-04-16: 7 mL via OROMUCOSAL

## 2014-04-16 MED ORDER — POLYETHYLENE GLYCOL 3350 17 GM/SCOOP PO POWD
8.5000 g | Freq: Two times a day (BID) | ORAL | Status: DC
Start: 2014-04-16 — End: 2014-09-19

## 2014-04-16 NOTE — Discharge Instructions (Signed)
Fecal Impaction °A fecal impaction happens when there is a large, firm amount of stool (or feces) that cannot be passed. The impacted stool is usually in the rectum, which is the lowest part of the large bowel. The impacted stool can block the colon and cause significant problems. °CAUSES  °The longer stool stays in the rectum, the harder it gets. Anything that slows down your bowel movements can lead to fecal impaction, such as: °· Constipation. This can be a long-standing (chronic) problem or can happen suddenly (acute). °· Painful conditions of the rectum, such as hemorrhoids or anal fissures. The pain of these conditions can make you try to avoid having bowel movements. °· Narcotic pain-relieving medicines, such as methadone, morphine, or codeine. °· Not drinking enough fluids. °· Inactivity and bed rest over long periods of time. °· Diseases of the brain or nervous system that damage the nerves controlling the muscles of the intestines. °SIGNS AND SYMPTOMS  °· Lack of normal bowel movements or changes in bowel patterns. °· Sense of fullness in the rectum but unable to pass stool. °· Pain or cramps in the abdominal area (often after meals). °· Thin, watery discharge from the rectum. °DIAGNOSIS  °Your health care provider may suspect that you have a fecal impaction based on your symptoms and a physical exam. This will include an exam of your rectum. Sometimes X-rays or lab testing may be needed to confirm the diagnosis and to be sure there are no other problems.  °TREATMENT  °· Initially an impaction can be removed manually. Using a gloved finger, your health care provider can remove hard stool from your rectum. °· Medicine is sometimes needed. A suppository or enema can be given in the rectum to soften the stool, which can stimulate a bowel movement. Medicines can also be given by mouth (orally). °· Though rare, surgery may be needed if the colon has torn (perforated) due to blockage. °HOME CARE INSTRUCTIONS   °· Develop regular bowel habits. This could include getting in the habit of having a bowel movement after your morning cup of coffee or after eating. Be sure to allow yourself enough time on the toilet. °· Maintain a high-fiber diet. °· Drink enough fluids to keep your urine clear or pale yellow as directed by your health care provider. °· Exercise regularly. °· If you begin to get constipated, increase the amount of fiber in your diet. Eat plenty of fruits, vegetables, whole wheat breads, bran, oatmeal, and similar products. °· Take natural fiber laxatives or other laxatives only as directed by your health care provider. °SEEK MEDICAL CARE IF:  °· You have ongoing rectal pain. °· You require enemas or suppositories more than twice a week. °· You have rectal bleeding. °· You have continued problems, or you develop abdominal pain. °· You have thin, pencil-like stools. °SEEK IMMEDIATE MEDICAL CARE IF:  °You have black or tarry stools. °MAKE SURE YOU:  °· Understand these instructions. °· Will watch your condition. °· Will get help right away if you are not doing well or get worse. °Document Released: 10/02/2003 Document Revised: 10/30/2012 Document Reviewed: 07/16/2012 °ExitCare® Patient Information ©2015 [employer]. This information is not intended to replace advice given to you by your health care provider. Make sure you discuss any questions you have with your health care provider. ° °

## 2014-04-16 NOTE — Discharge Summary (Signed)
[facility] Discharge Summary   Patient ID: Jennifer DollyKaren H Herrera MRN: [id number] DOB/AGE: 60/01/1955 60 y.o.  Admit date: 04/15/2014 Discharge date: 04/16/2014  Admitting Diagnosis: Chronic constipation Right lower quadrant pain Redundant transverse colon  Discharge Diagnosis Patient Active Problem List   Diagnosis Date Noted  . Chronic constipation 04/16/2014  . Redundant colon 04/16/2014  . Right lower quadrant pain 04/15/2014  . CVA 01/21/2010    Consultants None  Imaging: Ct Abdomen Pelvis W Contrast  04/15/2014   CLINICAL DATA:  Right lower quadrant pain for 3 days, diarrhea, history of irritable bowel syndrome  EXAM: CT ABDOMEN AND PELVIS WITH CONTRAST  TECHNIQUE: Multidetector CT imaging of the abdomen and pelvis was performed using the standard protocol following bolus administration of intravenous contrast.  CONTRAST:  100 cc Isovue-300  COMPARISON:  Ultrasound of the abdomen of 06/29/2011  FINDINGS: The lung bases are clear. Calcified bilateral breast implants are noted. The liver enhances with no focal abnormality and no ductal dilatation is seen. The gallbladder is not distended and no calcified gallstones are seen. The pancreas is normal in size and the pancreatic duct is not dilated. The adrenal glands and spleen are unremarkable. The stomach is moderately fluid distended. There is some thickening of the mucosa of the distal antrum -duodenal bulb of questionable significance. The kidneys enhance and no renal calculi are seen. There is no evidence of hydronephrosis. The abdominal aorta is normal in caliber. No adenopathy is seen.  The appendix is visualized retrocecal in position within the right lower quadrant -right upper pelvis. The proximal portion of the appendix appears normal. However the distal portion appears somewhat edematous although there is air within the lumen. Within the distal portion of the appendix, the diameter is approximately 10 mm which is  abnormal. Developing appendicitis cannot be excluded. The terminal ileum is unremarkable.  The urinary bladder is moderately distended with no abnormality noted. The uterus is normal in size. No adnexal lesion is seen, and no free fluid is seen within the pelvis. No abnormality of the colon is seen. The lumbar vertebrae are in normal alignment. There is mild degenerative disc disease at L5-S1.  IMPRESSION: 1. Somewhat thick-walled appendix near the tip measuring up to 10 mm in diameter although there is air present within the lumen. Developing acute appendicitis cannot be excluded. 2. Somewhat thickened mucosa of the distal antrum of the stomach -duodenal bulb of uncertain significance. This may well be normal but clinical correlation is recommended. 3. Mild degenerative disc disease at L5-S1.   Electronically Signed   By: Dwyane DeePaul  Barry M.D.   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 This started out as generalized abdominal pain. She does have irritable bowel syndrome and felt that she just had a lot of gas. She took a lot of Gas-X but this did not relieve her discomfort. She felt like she was "8 months pregnant". She had to hold her abdomen to make it feel better. She did not have nausea or vomiting during this time. She usually has constipation with her IBS but did have some diarrhea approximately 4-5 days ago. She denies fevers and chills. In the last 24 hours, she has had worsening pain and migration of the pain to the right lower quadrant. She states that walking made the pain worse. She also felt that sitting when the pain worse. She has had occasional night sweats, but  no change from her normal menopause related night sweats. She reports that the pain is around 6-7 out of 10.  She also had some significant chest pain/heartburn several days ago. This was bad enough that she consider stopping and getting an  aspirin.  Of note the patient has had chronic constipation since at least her teenage years.    Workup showed constipation and possible concern for tip appendicitis.  It was thought very unlikely that her pain was due to appendicitis.  Anatomically she has a very redundant transverse colon which was full of stool, this could be leading to her constipation.  Her vitals and labs remained normal.  Patient was admitted and was transferred to the floor.  She was started on go-lytely and received 4 doses.  She has had several BM's and is feeling much better.  Diet was advanced as tolerated.  On HD #2 the patient was voiding well, tolerating diet, ambulating well, pain well controlled, vital signs stable,and felt stable for discharge home.  Patient will follow up in our office as needed and knows to call with questions or concerns.  She should follow up with her PCP regarding her chronic constipation.  May eventually need GI referral.  We discussed increasing her physical activity, increased water intake, watching diet as well as an aggressive bowel regimen.  Her goal would be to have a BM 3x/week instead of 1x/week.        Medication List    TAKE these medications        ARTIFICIAL TEARS OP  Place 1 drop into both eyes daily as needed (dry eyes).     bisacodyl 10 MG suppository  Commonly known as:  DULCOLAX  Place 1 suppository (10 mg total) rectally daily as needed for mild constipation or moderate constipation.     cyanocobalamin 1000 MCG/ML injection  Commonly known as:  (VITAMIN B-12)  Inject 1 mL into the muscle once a week.     docusate sodium 100 MG capsule  Commonly known as:  COLACE  Take 1 capsule (100 mg total) by mouth 2 (two) times daily.     levothyroxine 88 MCG tablet  Commonly known as:  SYNTHROID, LEVOTHROID  Take 88 mcg by mouth daily before breakfast.     liothyronine 25 MCG tablet  Commonly known as:  CYTOMEL  Take 25 mcg by mouth daily.     polyethylene glycol powder  powder  Commonly known as:  MIRALAX  Take 8.5-34 g by mouth 2 (two) times daily. To correct constipation.  Adjust dose over 1-2 months.  Goal = ~3 bowel movement / week     topiramate 50 MG tablet  Commonly known as:  TOPAMAX  Take 50 mg by mouth daily.     valACYclovir 500 MG tablet  Commonly known as:  VALTREX  Take 500 mg by mouth 2 (two) times daily.     VITAMIN A PO  Take 1 tablet by mouth daily.     Vitamin D 2000 UNITS Caps  Take 1 capsule by mouth daily.         Follow-up Information    Follow up with Katy Apo, MD.   Specialty:  Internal Medicine   Why:  As needed   Contact information:   [address] [place] [address] [phone]       Signed: Aris [place], [facility] [phone]  04/16/2014, 2:26 PM

## 2014-04-16 NOTE — Progress Notes (Signed)
[facility] Progress Note     Subjective: Pt feels some better today.  No BM, but some flatus.  Abdomen is soft, but she feels bloated.  Ambulating well.  Says she's had trouble with constipation for years and has tried various things without good success.    Objective: Vital signs in last 24 hours: Temp:  [97.9 F (36.6 C)-98.2 F (36.8 C)] 97.9 F (36.6 C) (03/24 0510) Pulse Rate:  [63-78] 63 (03/24 0510) Resp:  [16-18] 16 (03/24 0510) BP: (91-137)/(58-99) 91/62 mmHg (03/24 0510) SpO2:  [96 %-100 %] 98 % (03/24 0510) Weight:  [68.04 kg (150 lb)-69.99 kg (154 lb 4.8 oz)] 69.99 kg (154 lb 4.8 oz) (03/23 2215) Last BM Date: 04/10/14  Intake/Output from previous day: 03/23 0701 - 03/24 0700 In: 500 [P.O.:500] Out: -  Intake/Output this shift:    PE: Gen:  Alert, NAD, pleasant Abd: Soft, ND, tender in RLQ mildly, +BS, no HSM   Lab Results:   Recent Labs  04/15/14 1725 04/15/14 1734 04/16/14 0516  WBC 8.6  --  6.1  HGB 15.0 16.3* 14.2  HCT 44.4 48.0* 42.2  PLT 295  --  267   BMET  Recent Labs  04/15/14 1734 04/16/14 0516  NA 138 139  K 3.7 3.9  CL 101 107  CO2  --  23  GLUCOSE 94 97  BUN 19 16  CREATININE 0.80 0.93  CALCIUM  --  9.5   PT/INR No results for input(s): LABPROT, INR in the last 72 hours. CMP     Component Value Date/Time   NA 139 04/16/2014 0516   K 3.9 04/16/2014 0516   CL 107 04/16/2014 0516   CO2 23 04/16/2014 0516   GLUCOSE 97 04/16/2014 0516   BUN 16 04/16/2014 0516   CREATININE 0.93 04/16/2014 0516   CALCIUM 9.5 04/16/2014 0516   GFRNONAA 66* 04/16/2014 0516   GFRAA 76* 04/16/2014 0516   Lipase  No results found for: LIPASE     Studies/Results: Ct Abdomen Pelvis W Contrast  04/15/2014   CLINICAL DATA:  Right lower quadrant pain for 3 days, diarrhea, history of irritable bowel syndrome  EXAM: CT ABDOMEN AND PELVIS WITH CONTRAST  TECHNIQUE: Multidetector CT imaging of the abdomen and pelvis was performed  using the standard protocol following bolus administration of intravenous contrast.  CONTRAST:  100 cc Isovue-300  COMPARISON:  Ultrasound of the abdomen of 06/29/2011  FINDINGS: The lung bases are clear. Calcified bilateral breast implants are noted. The liver enhances with no focal abnormality and no ductal dilatation is seen. The gallbladder is not distended and no calcified gallstones are seen. The pancreas is normal in size and the pancreatic duct is not dilated. The adrenal glands and spleen are unremarkable. The stomach is moderately fluid distended. There is some thickening of the mucosa of the distal antrum -duodenal bulb of questionable significance. The kidneys enhance and no renal calculi are seen. There is no evidence of hydronephrosis. The abdominal aorta is normal in caliber. No adenopathy is seen.  The appendix is visualized retrocecal in position within the right lower quadrant -right upper pelvis. The proximal portion of the appendix appears normal. However the distal portion appears somewhat edematous although there is air within the lumen. Within the distal portion of the appendix, the diameter is approximately 10 mm which is abnormal. Developing appendicitis cannot be excluded. The terminal ileum is unremarkable.  The urinary bladder is moderately distended with no abnormality noted. The uterus is normal  in size. No adnexal lesion is seen, and no free fluid is seen within the pelvis. No abnormality of the colon is seen. The lumbar vertebrae are in normal alignment. There is mild degenerative disc disease at L5-S1.  IMPRESSION: 1. Somewhat thick-walled appendix near the tip measuring up to 10 mm in diameter although there is air present within the lumen. Developing acute appendicitis cannot be excluded. 2. Somewhat thickened mucosa of the distal antrum of the stomach -duodenal bulb of uncertain significance. This may well be normal but clinical correlation is recommended. 3. Mild degenerative  disc disease at L5-S1.   Electronically Signed   By: Dwyane Dee M.D.   On: 04/15/2014 16:19    Anti-infectives: Anti-infectives    Start     Dose/Rate Route Frequency Ordered Stop   04/15/14 2300  valACYclovir (VALTREX) tablet 500 mg     500 mg Oral 2 times daily 04/15/14 2206         Assessment/Plan Right lower quadrant pain IBS with chronic constipation Redundant transverse colon -Labs are normal, imaging was concerned for tip appendicitis, but patient is improved today and her labs and vitals go against appendicitis.  Her exam is benign.   -Allow clear liquids -Continue bowel regimen -We had a long discussion about taking a stool softener, miralax daily and increasing water intake, activity, and adjusting diet to allow for better BM's. -No need for operation -If tolerating diet can likely d/c home today to continue with bowel regimen.  May need referral to dietitian or GI if still continues to have problems with constipation long term.      DORT, Ammy Lienhard 04/16/2014, 8:02 AM Pager: [phone]

## 2014-04-16 NOTE — Discharge Planning (Signed)
Patient discharged home in stable condition. Verbalizes understanding of all discharge instructions, including home medications and follow up appointments. 

## 2014-04-16 NOTE — Progress Notes (Signed)
UR completed 

## 2014-04-30 ENCOUNTER — Other Ambulatory Visit: Payer: Self-pay | Admitting: Obstetrics and Gynecology

## 2014-04-30 DIAGNOSIS — Z1231 Encounter for screening mammogram for malignant neoplasm of breast: Secondary | ICD-10-CM

## 2014-05-05 ENCOUNTER — Ambulatory Visit: Payer: No Typology Code available for payment source

## 2014-05-11 ENCOUNTER — Ambulatory Visit
Admission: RE | Admit: 2014-05-11 | Discharge: 2014-05-11 | Disposition: A | Discharge status: Home / Self Care | Payer: Managed Care, Other (non HMO) | Source: Ambulatory Visit | Attending: Obstetrics and Gynecology | Admitting: Obstetrics and Gynecology

## 2014-05-11 DIAGNOSIS — Z1231 Encounter for screening mammogram for malignant neoplasm of breast: Secondary | ICD-10-CM

## 2014-09-19 ENCOUNTER — Encounter (HOSPITAL_COMMUNITY): Payer: Self-pay | Admitting: *Deleted

## 2014-09-19 ENCOUNTER — Emergency Department (HOSPITAL_COMMUNITY): Payer: BLUE CROSS/BLUE SHIELD

## 2014-09-19 ENCOUNTER — Emergency Department (HOSPITAL_COMMUNITY)
Admission: EM | Admit: 2014-09-19 | Discharge: 2014-09-19 | Disposition: A | Discharge status: Home / Self Care | Payer: BLUE CROSS/BLUE SHIELD | Attending: Emergency Medicine | Admitting: Emergency Medicine

## 2014-09-19 DIAGNOSIS — Z7982 Long term (current) use of aspirin: Secondary | ICD-10-CM | POA: Insufficient documentation

## 2014-09-19 DIAGNOSIS — R079 Chest pain, unspecified: Secondary | ICD-10-CM

## 2014-09-19 DIAGNOSIS — R0602 Shortness of breath: Secondary | ICD-10-CM | POA: Insufficient documentation

## 2014-09-19 DIAGNOSIS — E079 Disorder of thyroid, unspecified: Secondary | ICD-10-CM | POA: Insufficient documentation

## 2014-09-19 DIAGNOSIS — Z79899 Other long term (current) drug therapy: Secondary | ICD-10-CM | POA: Insufficient documentation

## 2014-09-19 LAB — BASIC METABOLIC PANEL
Anion gap: 7 (ref 5–15)
BUN: 14 mg/dL (ref 6–20)
CHLORIDE: 105 mmol/L (ref 101–111)
CO2: 25 mmol/L (ref 22–32)
Calcium: 9.3 mg/dL (ref 8.9–10.3)
Creatinine, Ser: 0.88 mg/dL (ref 0.44–1.00)
GFR calc Af Amer: >60 mL/min (ref >60)
GFR calc non Af Amer: >60 mL/min (ref >60)
GLUCOSE: 99 mg/dL (ref 65–99)
POTASSIUM: 3.4 mmol/L — AB (ref 3.5–5.1)
Sodium: 137 mmol/L (ref 135–145)

## 2014-09-19 LAB — CBC
HEMATOCRIT: 42.5 % (ref 36.0–46.0)
HEMOGLOBIN: 14.5 g/dL (ref 12.0–15.0)
MCH: 31.7 pg (ref 26.0–34.0)
MCHC: 34.1 g/dL (ref 30.0–36.0)
MCV: 93 fL (ref 78.0–100.0)
Platelets: 311 10*3/uL (ref 150–400)
RBC: 4.57 MIL/uL (ref 3.87–5.11)
RDW: 13 % (ref 11.5–15.5)
WBC: 8.8 10*3/uL (ref 4.0–10.5)

## 2014-09-19 LAB — I-STAT TROPONIN, ED: Troponin i, poc: 0 ng/mL (ref 0.00–0.08)

## 2014-09-19 LAB — D-DIMER, QUANTITATIVE (NOT AT ARMC)

## 2014-09-19 MED ORDER — NAPROXEN 500 MG PO TABS: 500.0000 mg | ORAL_TABLET | Freq: Two times a day (BID) | ORAL | Status: DC

## 2014-09-19 MED ORDER — LANSOPRAZOLE 15 MG PO TBDP: 15.0000 mg | ORAL_TABLET | Freq: Every day | ORAL | Status: DC

## 2014-09-19 NOTE — Discharge Instructions (Signed)

## 2014-09-19 NOTE — ED Notes (Signed)
Nurse starting IV 

## 2014-09-19 NOTE — ED Notes (Signed)
Delay in lab draw, pt in exray 

## 2014-09-19 NOTE — ED Notes (Signed)
Pt complains of intermittent chest pain and shortness of breath since Monday night. Pt states the pain went away on Wednesday, then came back at 4AM this morning. Pt states she took a Prilosec without relief. Pt also complains of pressure in her head, around her ears.

## 2014-09-19 NOTE — ED Provider Notes (Signed)
CSN: [id number]     Arrival date & time 09/19/14  1230 History   First MD Initiated Contact with Patient 09/19/14 1323     Chief Complaint  Patient presents with  . Chest Pain  . Shortness of Breath    HPI Comments: First started on Monday night.  Felt like  A brick on her chest.  Initially thought it was because a pill she took did not go down properly.  The pain started again earlier in the week while at work.  It improved on Wednesday but still had some pain with breathing.  She felt well enough to go dancing later in the week until last night she started having the symptoms again.  She tried taking aspirin and prilosec without relief.  Patient is a 59 y.o. female presenting with chest pain and shortness of breath. The history is provided by the patient.  Chest Pain Pain location:  Substernal area Pain quality: aching and pressure   Radiates to: neck and shoulder. Pain severity:  Moderate Onset quality:  Gradual Duration:  1 week Timing:  Intermittent Progression:  Worsening Chronicity:  New Context: breathing   Associated symptoms: shortness of breath   Associated symptoms: no abdominal pain, no cough and no fever   Risk factors: no aortic disease, no coronary artery disease, no diabetes mellitus, no high cholesterol, no hypertension and no prior DVT/PE   Shortness of Breath Associated symptoms: chest pain   Associated symptoms: no abdominal pain, no cough and no fever     Past Medical History  Diagnosis Date  . Thyroid disease    Past Surgical History  Procedure Laterality Date  . Breast enhancement surgery    . Shoulder arthroscopy    . Tonsillectomy     No family history on file. Social History  Substance Use Topics  . Smoking status: Never Smoker   . Smokeless tobacco: None  . Alcohol Use: Yes     Comment: once weekly   OB History    No data available     Review of Systems  Constitutional: Negative for fever.  Respiratory: Positive for shortness of  breath. Negative for cough.   Cardiovascular: Positive for chest pain.  Gastrointestinal: Negative for abdominal pain.  All other systems reviewed and are negative.     Allergies  Sulfa antibiotics  Home Medications   Prior to Admission medications   Medication Sig Start Date End Date Taking? Authorizing Provider  aspirin EC 81 MG tablet Take 81 mg by mouth once.   Yes Historical Provider, MD  Cholecalciferol (VITAMIN D) 2000 UNITS CAPS Take 1 capsule by mouth daily.   Yes Historical Provider, MD  cyanocobalamin (,VITAMIN B-12,) 1000 MCG/ML injection Inject 1 mL into the muscle once a week. 03/31/14  Yes Historical Provider, MD  levothyroxine (SYNTHROID, LEVOTHROID) 88 MCG tablet Take 88 mcg by mouth daily before breakfast.   Yes Historical Provider, MD  liothyronine (CYTOMEL) 5 MCG tablet TAKE 1 TABLET BY MOUTH EVERY MORNING AND TAKE 1 TABLET BY MOUTH IN THE AFTERNOON AS NEEDED FOR LOW ENERGY LEVEL 08/25/14  Yes Historical Provider, MD  OVER THE COUNTER MEDICATION Take 1 capsule by mouth daily. Iodine capsule   Yes Historical Provider, MD  VITAMIN A PO Take 1 tablet by mouth daily.   Yes Historical Provider, MD  lansoprazole (PREVACID SOLUTAB) 15 MG disintegrating tablet Take 1 tablet (15 mg total) by mouth daily at 12 noon. 09/19/14   Linwood Dibbles, MD  naproxen (NAPROSYN) 500 MG  tablet Take 1 tablet (500 mg total) by mouth 2 (two) times daily. 09/19/14   Linwood Dibbles, MD  valACYclovir (VALTREX) 500 MG tablet Take 500 mg by mouth 2 (two) times daily. 01/14/14   Historical Provider, MD   BP 124/69 mmHg  Pulse 82  Temp(Src) 98.6 F (37 C) (Oral)  Resp 21  SpO2 96% Physical Exam  Constitutional: She appears well-developed and well-nourished. No distress.  HENT:  Head: Normocephalic and atraumatic.  Right Ear: Tympanic membrane and external ear normal.  Left Ear: Tympanic membrane and external ear normal.  Eyes: Conjunctivae are normal. Right eye exhibits no discharge. Left eye exhibits no  discharge. No scleral icterus.  Neck: Neck supple. No tracheal deviation present.  Cardiovascular: Normal rate, regular rhythm and intact distal pulses.   Pulmonary/Chest: Effort normal and breath sounds normal. No stridor. No respiratory distress. She has no wheezes. She has no rales.  Abdominal: Soft. Bowel sounds are normal. She exhibits no distension. There is no tenderness. There is no rebound and no guarding.  Musculoskeletal: She exhibits no edema or tenderness.  Neurological: She is alert. She has normal strength. No cranial nerve deficit (no facial droop, extraocular movements intact, no slurred speech) or sensory deficit. She exhibits normal muscle tone. She displays no seizure activity. Coordination normal.  Skin: Skin is warm and dry. No rash noted.  Psychiatric: She has a normal mood and affect.  Nursing note and vitals reviewed.   ED Course  Procedures (including critical care time) Labs Review Labs Reviewed  BASIC METABOLIC PANEL - Abnormal; Notable for the following:    Potassium 3.4 (*)    All other components within normal limits  CBC  D-DIMER, QUANTITATIVE (NOT AT [facility])  I-STAT TROPOININ, ED    Imaging Review Dg Chest 2 View  09/19/2014   CLINICAL DATA:  Mid chest pain beginning 5 days ago, which went away but came back last night.  EXAM: CHEST  2 VIEW  COMPARISON:  None.  FINDINGS: Cardiac silhouette is normal in size and configuration. Normal mediastinal and hilar contours.  Lungs are clear.  No pleural effusion or pneumothorax.  Skeletal structures are demineralized but intact. There is a left breast prosthesis, peripherally calcified.  IMPRESSION: No active cardiopulmonary disease.   Electronically Signed   By: Amie Portland M.D.   On: 09/19/2014 13:12   I have personally reviewed and evaluated these images and lab results as part of my medical decision-making.   EKG Interpretation   Date/Time:  Saturday September 19 2014 12:37:14 EDT Ventricular Rate:  98 PR  Interval:  136 QRS Duration: 78 QT Interval:  344 QTC Calculation: 439 R Axis:   78 Text Interpretation:  Sinus tachycardia Atrial premature complex  Borderline T wave abnormalities No old tracing to compare Confirmed by  Kasara Schomer  MD-J, Biannca Scantlin ([id number]) on 09/19/2014 12:55:31 PM      MDM   Final diagnoses:  Chest pain, unspecified chest pain type    The patient's EKG, chest x-ray and laboratory tests are unremarkable.  She's had constant pain since 4 AM and has had a normal troponin. I doubt acute coronary syndrome. Patient is low risk for PE with a negative d-dimer.  Patient's symptoms could be related to pericarditis or pleurisy. She does have some discomfort with deep breathing. There is a positional component. Esophageal reflux is another possibility. She has noticed worsening symptoms when she lies flat. I will discharge her home on a prescription for Naprosyn and Prevacid. I discussed outpatient  follow-up with her primary care doctor.  At this time there does not appear to be any evidence of an acute emergency medical condition and the patient appears stable for discharge with appropriate outpatient follow up.    Linwood Dibbles, MD 09/19/14 [phone]

## 2016-05-31 ENCOUNTER — Ambulatory Visit
Admission: RE | Admit: 2016-05-31 | Discharge: 2016-05-31 | Disposition: A | Discharge status: Home / Self Care | Payer: BLUE CROSS/BLUE SHIELD | Source: Ambulatory Visit | Attending: Geriatric Medicine | Admitting: Geriatric Medicine

## 2016-05-31 ENCOUNTER — Other Ambulatory Visit: Payer: Self-pay | Admitting: Geriatric Medicine

## 2016-05-31 DIAGNOSIS — R059 Cough, unspecified: Secondary | ICD-10-CM

## 2016-05-31 DIAGNOSIS — J9801 Acute bronchospasm: Secondary | ICD-10-CM

## 2016-05-31 DIAGNOSIS — R05 Cough: Secondary | ICD-10-CM

## 2017-02-15 LAB — HM DEXA SCAN

## 2017-05-07 DIAGNOSIS — E039 Hypothyroidism, unspecified: Secondary | ICD-10-CM | POA: Diagnosis not present

## 2017-05-07 DIAGNOSIS — M79662 Pain in left lower leg: Secondary | ICD-10-CM | POA: Diagnosis not present

## 2017-05-07 DIAGNOSIS — M7501 Adhesive capsulitis of right shoulder: Secondary | ICD-10-CM | POA: Diagnosis not present

## 2017-05-07 DIAGNOSIS — R7303 Prediabetes: Secondary | ICD-10-CM | POA: Diagnosis not present

## 2017-05-07 DIAGNOSIS — M79661 Pain in right lower leg: Secondary | ICD-10-CM | POA: Diagnosis not present

## 2017-05-08 DIAGNOSIS — R946 Abnormal results of thyroid function studies: Secondary | ICD-10-CM | POA: Diagnosis not present

## 2017-05-08 DIAGNOSIS — R7303 Prediabetes: Secondary | ICD-10-CM | POA: Diagnosis not present

## 2017-05-08 DIAGNOSIS — E039 Hypothyroidism, unspecified: Secondary | ICD-10-CM | POA: Diagnosis not present

## 2017-05-14 DIAGNOSIS — K5900 Constipation, unspecified: Secondary | ICD-10-CM | POA: Diagnosis not present

## 2017-05-14 DIAGNOSIS — R1084 Generalized abdominal pain: Secondary | ICD-10-CM | POA: Diagnosis not present

## 2017-06-07 ENCOUNTER — Other Ambulatory Visit: Payer: Self-pay | Admitting: Internal Medicine

## 2017-06-07 DIAGNOSIS — R109 Unspecified abdominal pain: Secondary | ICD-10-CM

## 2017-06-07 DIAGNOSIS — K581 Irritable bowel syndrome with constipation: Secondary | ICD-10-CM | POA: Diagnosis not present

## 2017-06-07 DIAGNOSIS — R1030 Lower abdominal pain, unspecified: Secondary | ICD-10-CM | POA: Diagnosis not present

## 2017-06-08 ENCOUNTER — Ambulatory Visit
Admission: RE | Admit: 2017-06-08 | Discharge: 2017-06-08 | Disposition: A | Discharge status: Home / Self Care | Payer: 59 | Source: Ambulatory Visit | Attending: Internal Medicine | Admitting: Internal Medicine

## 2017-06-08 DIAGNOSIS — R109 Unspecified abdominal pain: Secondary | ICD-10-CM | POA: Diagnosis not present

## 2017-06-08 DIAGNOSIS — K5900 Constipation, unspecified: Secondary | ICD-10-CM | POA: Diagnosis not present

## 2017-06-08 MED ORDER — IOPAMIDOL (ISOVUE-300) INJECTION 61%
100.0000 mL | Freq: Once | INTRAVENOUS | Status: AC | PRN
  Administered 2017-06-08: 100 mL via INTRAVENOUS

## 2017-06-13 DIAGNOSIS — Z1211 Encounter for screening for malignant neoplasm of colon: Secondary | ICD-10-CM | POA: Diagnosis not present

## 2017-06-13 DIAGNOSIS — K5909 Other constipation: Secondary | ICD-10-CM | POA: Diagnosis not present

## 2017-06-13 DIAGNOSIS — K581 Irritable bowel syndrome with constipation: Secondary | ICD-10-CM | POA: Diagnosis not present

## 2017-07-05 DIAGNOSIS — K921 Melena: Secondary | ICD-10-CM | POA: Diagnosis not present

## 2017-07-09 DIAGNOSIS — R946 Abnormal results of thyroid function studies: Secondary | ICD-10-CM | POA: Diagnosis not present

## 2017-07-09 DIAGNOSIS — K581 Irritable bowel syndrome with constipation: Secondary | ICD-10-CM | POA: Diagnosis not present

## 2017-07-09 DIAGNOSIS — R634 Abnormal weight loss: Secondary | ICD-10-CM | POA: Diagnosis not present

## 2017-07-09 DIAGNOSIS — E039 Hypothyroidism, unspecified: Secondary | ICD-10-CM | POA: Diagnosis not present

## 2017-07-09 DIAGNOSIS — Z6825 Body mass index (BMI) 25.0-25.9, adult: Secondary | ICD-10-CM | POA: Diagnosis not present

## 2017-07-18 DIAGNOSIS — K5909 Other constipation: Secondary | ICD-10-CM | POA: Diagnosis not present

## 2017-08-19 DIAGNOSIS — L259 Unspecified contact dermatitis, unspecified cause: Secondary | ICD-10-CM | POA: Diagnosis not present

## 2017-11-05 DIAGNOSIS — K5909 Other constipation: Secondary | ICD-10-CM | POA: Diagnosis not present

## 2017-11-05 DIAGNOSIS — K581 Irritable bowel syndrome with constipation: Secondary | ICD-10-CM | POA: Diagnosis not present

## 2017-11-09 ENCOUNTER — Ambulatory Visit
Admission: RE | Admit: 2017-11-09 | Discharge: 2017-11-09 | Disposition: A | Discharge status: Home / Self Care | Payer: 59 | Source: Ambulatory Visit | Attending: Physician Assistant | Admitting: Physician Assistant

## 2017-11-09 ENCOUNTER — Other Ambulatory Visit: Payer: Self-pay | Admitting: Physician Assistant

## 2017-11-09 DIAGNOSIS — R195 Other fecal abnormalities: Secondary | ICD-10-CM | POA: Diagnosis not present

## 2017-11-09 DIAGNOSIS — K581 Irritable bowel syndrome with constipation: Secondary | ICD-10-CM

## 2017-11-09 DIAGNOSIS — K5909 Other constipation: Secondary | ICD-10-CM

## 2017-11-12 ENCOUNTER — Ambulatory Visit
Admission: RE | Admit: 2017-11-12 | Discharge: 2017-11-12 | Disposition: A | Discharge status: Home / Self Care | Payer: 59 | Source: Ambulatory Visit | Attending: Physician Assistant | Admitting: Physician Assistant

## 2017-11-12 ENCOUNTER — Other Ambulatory Visit: Payer: Self-pay | Admitting: Physician Assistant

## 2017-11-12 DIAGNOSIS — K5909 Other constipation: Secondary | ICD-10-CM

## 2017-11-12 DIAGNOSIS — R195 Other fecal abnormalities: Secondary | ICD-10-CM | POA: Diagnosis not present

## 2017-12-10 DIAGNOSIS — M25511 Pain in right shoulder: Secondary | ICD-10-CM | POA: Diagnosis not present

## 2017-12-10 DIAGNOSIS — M19011 Primary osteoarthritis, right shoulder: Secondary | ICD-10-CM | POA: Diagnosis not present

## 2017-12-10 DIAGNOSIS — M19019 Primary osteoarthritis, unspecified shoulder: Secondary | ICD-10-CM | POA: Diagnosis not present

## 2017-12-17 DIAGNOSIS — K5909 Other constipation: Secondary | ICD-10-CM | POA: Diagnosis not present

## 2017-12-17 DIAGNOSIS — K581 Irritable bowel syndrome with constipation: Secondary | ICD-10-CM | POA: Diagnosis not present

## 2017-12-26 DIAGNOSIS — Z01419 Encounter for gynecological examination (general) (routine) without abnormal findings: Secondary | ICD-10-CM | POA: Diagnosis not present

## 2017-12-26 DIAGNOSIS — Z6825 Body mass index (BMI) 25.0-25.9, adult: Secondary | ICD-10-CM | POA: Diagnosis not present

## 2017-12-26 LAB — RESULTS CONSOLE HPV: CHL HPV: NEGATIVE

## 2018-02-12 DIAGNOSIS — R69 Illness, unspecified: Secondary | ICD-10-CM | POA: Diagnosis not present

## 2018-02-12 DIAGNOSIS — Z13220 Encounter for screening for lipoid disorders: Secondary | ICD-10-CM | POA: Diagnosis not present

## 2018-02-12 DIAGNOSIS — E039 Hypothyroidism, unspecified: Secondary | ICD-10-CM | POA: Diagnosis not present

## 2018-02-12 DIAGNOSIS — R7309 Other abnormal glucose: Secondary | ICD-10-CM | POA: Diagnosis not present

## 2018-02-12 DIAGNOSIS — R7302 Impaired glucose tolerance (oral): Secondary | ICD-10-CM | POA: Diagnosis not present

## 2018-02-12 DIAGNOSIS — R5383 Other fatigue: Secondary | ICD-10-CM | POA: Diagnosis not present

## 2018-02-12 DIAGNOSIS — N951 Menopausal and female climacteric states: Secondary | ICD-10-CM | POA: Diagnosis not present

## 2018-02-12 DIAGNOSIS — E559 Vitamin D deficiency, unspecified: Secondary | ICD-10-CM | POA: Diagnosis not present

## 2018-03-14 DIAGNOSIS — E559 Vitamin D deficiency, unspecified: Secondary | ICD-10-CM | POA: Diagnosis not present

## 2018-03-14 DIAGNOSIS — E039 Hypothyroidism, unspecified: Secondary | ICD-10-CM | POA: Diagnosis not present

## 2018-03-14 DIAGNOSIS — R7302 Impaired glucose tolerance (oral): Secondary | ICD-10-CM | POA: Diagnosis not present

## 2018-03-14 DIAGNOSIS — N951 Menopausal and female climacteric states: Secondary | ICD-10-CM | POA: Diagnosis not present

## 2018-04-18 ENCOUNTER — Encounter (INDEPENDENT_AMBULATORY_CARE_PROVIDER_SITE_OTHER): Payer: Self-pay | Admitting: Internal Medicine

## 2018-04-25 ENCOUNTER — Ambulatory Visit (INDEPENDENT_AMBULATORY_CARE_PROVIDER_SITE_OTHER): Payer: Self-pay | Admitting: Internal Medicine

## 2018-04-25 DIAGNOSIS — R6882 Decreased libido: Secondary | ICD-10-CM | POA: Diagnosis not present

## 2018-04-25 DIAGNOSIS — E039 Hypothyroidism, unspecified: Secondary | ICD-10-CM | POA: Diagnosis not present

## 2018-04-25 DIAGNOSIS — N951 Menopausal and female climacteric states: Secondary | ICD-10-CM | POA: Diagnosis not present

## 2018-04-29 DIAGNOSIS — E039 Hypothyroidism, unspecified: Secondary | ICD-10-CM | POA: Diagnosis not present

## 2018-04-29 DIAGNOSIS — E559 Vitamin D deficiency, unspecified: Secondary | ICD-10-CM | POA: Diagnosis not present

## 2018-04-29 DIAGNOSIS — R5383 Other fatigue: Secondary | ICD-10-CM | POA: Diagnosis not present

## 2018-05-07 DIAGNOSIS — E039 Hypothyroidism, unspecified: Secondary | ICD-10-CM | POA: Diagnosis not present

## 2018-05-07 DIAGNOSIS — N951 Menopausal and female climacteric states: Secondary | ICD-10-CM | POA: Diagnosis not present

## 2018-05-07 DIAGNOSIS — R6882 Decreased libido: Secondary | ICD-10-CM | POA: Diagnosis not present

## 2018-05-20 ENCOUNTER — Other Ambulatory Visit: Payer: Self-pay | Admitting: Internal Medicine

## 2018-05-20 DIAGNOSIS — N6320 Unspecified lump in the left breast, unspecified quadrant: Secondary | ICD-10-CM

## 2018-05-28 ENCOUNTER — Other Ambulatory Visit: Payer: Self-pay | Admitting: Internal Medicine

## 2018-05-28 ENCOUNTER — Ambulatory Visit
Admission: RE | Admit: 2018-05-28 | Discharge: 2018-05-28 | Disposition: A | Discharge status: Home / Self Care | Payer: 59 | Source: Ambulatory Visit | Attending: Internal Medicine | Admitting: Internal Medicine

## 2018-05-28 ENCOUNTER — Other Ambulatory Visit: Payer: Self-pay

## 2018-05-28 DIAGNOSIS — N6320 Unspecified lump in the left breast, unspecified quadrant: Secondary | ICD-10-CM

## 2018-05-28 DIAGNOSIS — T8549XA Other mechanical complication of breast prosthesis and implant, initial encounter: Secondary | ICD-10-CM | POA: Diagnosis not present

## 2018-06-07 DIAGNOSIS — N951 Menopausal and female climacteric states: Secondary | ICD-10-CM | POA: Diagnosis not present

## 2018-06-07 DIAGNOSIS — R6882 Decreased libido: Secondary | ICD-10-CM | POA: Diagnosis not present

## 2018-06-07 DIAGNOSIS — E039 Hypothyroidism, unspecified: Secondary | ICD-10-CM | POA: Diagnosis not present

## 2018-10-01 ENCOUNTER — Other Ambulatory Visit (INDEPENDENT_AMBULATORY_CARE_PROVIDER_SITE_OTHER): Payer: Self-pay | Admitting: Internal Medicine

## 2018-10-01 MED ORDER — NONFORMULARY OR COMPOUNDED ITEM: 5.0000 mg | 3 refills | Status: DC

## 2018-10-01 NOTE — Progress Notes (Signed)
I have called in a prescription for testosterone injections 0.2 mL twice a week for this patient with 2 refills.  [facility]

## 2018-10-04 ENCOUNTER — Other Ambulatory Visit: Payer: Self-pay | Admitting: *Deleted

## 2018-10-04 DIAGNOSIS — Z20822 Contact with and (suspected) exposure to covid-19: Secondary | ICD-10-CM

## 2018-10-05 LAB — NOVEL CORONAVIRUS, NAA: SARS-CoV-2, NAA: NOT DETECTED

## 2018-11-20 ENCOUNTER — Other Ambulatory Visit: Payer: Self-pay

## 2018-11-20 ENCOUNTER — Ambulatory Visit (INDEPENDENT_AMBULATORY_CARE_PROVIDER_SITE_OTHER): Payer: 59 | Admitting: Internal Medicine

## 2018-11-20 ENCOUNTER — Encounter (INDEPENDENT_AMBULATORY_CARE_PROVIDER_SITE_OTHER): Payer: Self-pay | Admitting: Internal Medicine

## 2018-11-20 VITALS — BP 120/80 | HR 64 | Ht 65.5 in | Wt 158.2 lb

## 2018-11-20 DIAGNOSIS — R7303 Prediabetes: Secondary | ICD-10-CM | POA: Diagnosis not present

## 2018-11-20 DIAGNOSIS — Z1159 Encounter for screening for other viral diseases: Secondary | ICD-10-CM | POA: Diagnosis not present

## 2018-11-20 DIAGNOSIS — E039 Hypothyroidism, unspecified: Secondary | ICD-10-CM | POA: Diagnosis not present

## 2018-11-20 DIAGNOSIS — E1169 Type 2 diabetes mellitus with other specified complication: Secondary | ICD-10-CM | POA: Insufficient documentation

## 2018-11-20 DIAGNOSIS — E782 Mixed hyperlipidemia: Secondary | ICD-10-CM

## 2018-11-20 DIAGNOSIS — E559 Vitamin D deficiency, unspecified: Secondary | ICD-10-CM

## 2018-11-20 DIAGNOSIS — E785 Hyperlipidemia, unspecified: Secondary | ICD-10-CM

## 2018-11-20 HISTORY — DX: Vitamin D deficiency, unspecified: E55.9

## 2018-11-20 HISTORY — DX: Hyperlipidemia, unspecified: E78.5

## 2018-11-20 HISTORY — DX: Prediabetes: R73.03

## 2018-11-20 HISTORY — DX: Hypothyroidism, unspecified: E03.9

## 2018-11-20 NOTE — Progress Notes (Signed)
Metrics: Intervention Frequency ACO  Documented Smoking Status Yearly  Screened one or more times in 24 months  Cessation Counseling or  Active cessation medication Past 24 months  Past 24 months   Guideline developer: UpToDate (See UpToDate for funding source) Date Released: 2014       Wellness Office Visit  Subjective:  Patient ID: Jennifer Herrera, female    DOB: 1955/01/28  Age: 63 y.o. MRN: [id number]  CC: This lady comes in for follow-up of hypothyroidism, hyperlipidemia, prediabetes and vitamin D deficiency. HPI  She has been under a lot of stress at work and also COVID-19 pandemic is causing stress and as a result, she has not really been able to lose weight.  She is doing intermittent fasting most days but I think she is not very consistent with the food that she is eating. She continues to take desiccated NP thyroid for hypothyroidism and her T3 levels were in optimal range the last time they were checked. Her hemoglobin A1c was 5.9% and she continues on Metformin.  She thinks she should be taking this twice a day. She continues with vitamin D3 supplementation for vitamin D deficiency and her vitamin D level was at 94 the last time it was checked. She denies any chest pain, dyspnea, palpitations or limb weakness. Past Medical History:  Diagnosis Date  . HLD (hyperlipidemia) 11/20/2018  . Hypothyroidism, adult 11/20/2018  . Prediabetes 11/20/2018  . Vitamin D deficiency disease 11/20/2018      History reviewed. No pertinent family history.  Social History   Social History Narrative   Married for 1 year,second marriage.First marriage lasted 40 yrs.RN,works for [employer].   Social History   Tobacco Use  . Smoking status: Never Smoker  . Smokeless tobacco: Never Used  Substance Use Topics  . Alcohol use: Yes    Alcohol/week: 2.0 standard drinks    Types: 2 Glasses of wine per week    Current Meds  Medication Sig  . Cholecalciferol (VITAMIN D-3) 125 MCG (5000  UT) TABS Take 1 tablet by mouth daily.  . metFORMIN (GLUCOPHAGE) 500 MG tablet Take 500 mg by mouth daily with breakfast.  . NP THYROID 120 MG tablet Take 120 mg by mouth daily before breakfast.  . TESTOSTERONE CYPIONATE IM Inject 5 mg into the muscle 2 (two) times a week.     Nutrition  Usually intermittent fasting on a daily basis but eating food is not been consistent. Sleep  Adequate.  Exercise  None regular. Bio Identical Hormones  Testosterone therapy is being used off label for symptoms of testosterone deficiency and benefits that it produces based on several studies.  These benefits include decreasing body fat, increasing in lean muscle mass and increasing in bone density.  There is improvement of memory, cognition.  There is improvement in exercise tolerance and endurance.  Testosterone therapy has also been shown to be protective against coronary artery disease, cerebrovascular disease, diabetes, hypertension and degenerative joint disease. I have discussed with the patient the FDA warnings regarding testosterone therapy, benefits and side effects and modes of administration as well as monitoring blood levels and side effects  on a regular basis The patient is agreeable that testosterone therapy should be an integral part of his/her wellness,quality of life and prevention of chronic disease.  Objective:   Today's Vitals: BP 120/80   Pulse 64   Ht 5' 5.5" (1.664 m)   Wt 158 lb 3.2 oz (71.8 kg)   BMI 25.93 kg/m  Vitals with  BMI 11/20/2018  Height 5' 5.5"  Weight 158 lbs 3 oz  BMI 25.92  Systolic 120  Diastolic 80  Pulse 64     Physical Exam  She looks systemically well.  Blood pressure is well controlled.  Her weight is not changed since the last time I saw her.  She is alert and orientated without any obvious focal neurological signs.     Assessment   1. Encounter for hepatitis C screening test for low risk patient   2. Hypothyroidism, adult   3. Vitamin D  deficiency disease   4. Prediabetes   5. Mixed hyperlipidemia       Tests ordered Orders Placed This Encounter  Procedures  . COMPLETE METABOLIC PANEL WITH GFR  . Hemoglobin A1c  . Hepatitis C antibody     Plan: 1. She will continue with desiccated NP thyroid for hypothyroidism as before. 2. She will continue with vitamin D3 supplementation for vitamin D deficiency. 3. Her hyperlipidemia does not require statin therapy. 4. We will check A1c for prediabetes and we may need to alter the dose of Metformin but she will continue with this for the time being. 5. She was given Tdap vaccination today. 6. I will see her in about 3 months time for annual physical exam.   No orders of the defined types were placed in this encounter.   Wilson Singer, MD

## 2018-11-20 NOTE — Patient Instructions (Signed)
[facility] Dietary Recommendations for Weight Loss What to Avoid . Avoid added sugars o Often added sugar can be found in processed foods such as many condiments, dry cereals, cakes, cookies, chips, crisps, crackers, candies, sweetened drinks, etc.  o Read labels and AVOID/DECREASE use of foods with the following in their ingredient list: Sugar, fructose, high fructose corn syrup, sucrose, glucose, maltose, dextrose, molasses, cane sugar, brown sugar, any type of syrup, agave nectar, etc.   . Avoid snacking in between meals . Avoid foods made with flour o If you are going to eat food made with flour, choose those made with whole-grains; and, minimize your consumption as much as is tolerable . Avoid processed foods o These foods are generally stocked in the middle of the grocery store. Focus on shopping on the perimeter of the grocery.  What to Include . Vegetables o GREEN LEAFY VEGETABLES: Kale, spinach, mustard greens, collard greens, cabbage, broccoli, etc. o OTHER: Asparagus, cauliflower, eggplant, carrots, peas, Brussel sprouts, tomatoes, bell peppers, zucchini, beets, cucumbers, etc. . Grains, seeds, and legumes o Beans: kidney beans, black eyed peas, garbanzo beans, black beans, pinto beans, etc. o Whole, unrefined grains: brown rice, barley, bulgur, oatmeal, etc. . Healthy fats  o Avoid highly processed fats such as vegetable oil o Examples of healthy fats: avocado, olives, virgin olive oil, dark chocolate (?72% Cocoa), nuts (peanuts, almonds, walnuts, cashews, pecans, etc.) . Low - Moderate Intake of Animal Sources of Protein o Meat sources: chicken, turkey, salmon, tuna. Limit to 4 ounces of meat at one time. o Consider limiting dairy sources, but when choosing dairy focus on: PLAIN Greek yogurt, cottage cheese, high-protein milk . Fruit o Choose berries  When to Eat . Intermittent Fasting: o Choosing not to eat for a specific time period, but DO FOCUS ON HYDRATION  when fasting o Multiple Techniques: - Time Restricted Eating: eat 3 meals in a day, each meal lasting no more than 60 minutes, no snacks between meals - 16-18 hour fast: fast for 16 to 18 hours up to 7 days a week. Often suggested to start with 2-3 nonconsecutive days per week.  . Remember the time you sleep is counted as fasting.  . Examples of eating schedule: Fast from 7:00pm-11:00am. Eat between 11:00am-7:00pm.  - 24-hour fast: fast for 24 hours up to every other day. Often suggested to start with 1 day per week . Remember the time you sleep is counted as fasting . Examples of eating schedule:  o Eating day: eat 2-3 meals on your eating day. If doing 2 meals, each meal should last no more than 90 minutes. If doing 3 meals, each meal should last no more than 60 minutes. Finish last meal by 7:00pm. o Fasting day: Fast until 7:00pm.  o IF YOU FEEL UNWELL FOR ANY REASON/IN ANY WAY WHEN FASTING, STOP FASTING BY EATING A NUTRITIOUS SNACK OR LIGHT MEAL o ALWAYS FOCUS ON HYDRATION DURING FASTS - Acceptable Hydration sources: water, broths, tea/coffee (black tea/coffee is best but using a small amount of whole-fat dairy products in coffee/tea is acceptable).  - Poor Hydration Sources: anything with sugar or artificial sweeteners added to it  These recommendations have been developed for patients that are actively receiving medical care from either Dr.  or Sarah Gray, DNP, NP-C at  [facility]. These recommendations are developed for patients with specific medical conditions and are not meant to be distributed or used by others that are not actively receiving care from either provider listed   above at  [facility]. It is not appropriate to participate in the above eating plans without proper medical supervision.   Reference: Fung, J. The obesity code. [place]: [place]; 2016.   

## 2018-12-16 ENCOUNTER — Other Ambulatory Visit (INDEPENDENT_AMBULATORY_CARE_PROVIDER_SITE_OTHER): Payer: Self-pay | Admitting: Internal Medicine

## 2018-12-16 MED ORDER — NONFORMULARY OR COMPOUNDED ITEM: 5.0000 mg | 3 refills | Status: DC

## 2018-12-25 ENCOUNTER — Other Ambulatory Visit (INDEPENDENT_AMBULATORY_CARE_PROVIDER_SITE_OTHER): Payer: Self-pay | Admitting: Internal Medicine

## 2018-12-25 LAB — COMPLETE METABOLIC PANEL WITH GFR
AG Ratio: 1.6 (calc) (ref 1.0–2.5)
ALT: 18 U/L (ref 6–29)
AST: 18 U/L (ref 10–35)
Albumin: 4.2 g/dL (ref 3.6–5.1)
Alkaline phosphatase (APISO): 125 U/L (ref 37–153)
BUN: 13 mg/dL (ref 7–25)
CO2: 26 mmol/L (ref 20–32)
Calcium: 10 mg/dL (ref 8.6–10.4)
Chloride: 104 mmol/L (ref 98–110)
Creat: 0.81 mg/dL (ref 0.50–0.99)
GFR, Est African American: 89 mL/min/{1.73_m2} (ref >=60)
GFR, Est Non African American: 77 mL/min/{1.73_m2} (ref >=60)
Globulin: 2.6 g/dL (calc) (ref 1.9–3.7)
Glucose, Bld: 122 mg/dL — ABNORMAL HIGH (ref 65–99)
Potassium: 4.9 mmol/L (ref 3.5–5.3)
Sodium: 139 mmol/L (ref 135–146)
Total Bilirubin: 1 mg/dL (ref 0.2–1.2)
Total Protein: 6.8 g/dL (ref 6.1–8.1)

## 2018-12-25 LAB — HEPATITIS C ANTIBODY
Hepatitis C Ab: NONREACTIVE
SIGNAL TO CUT-OFF: 0.01 (ref <1.00)

## 2018-12-25 LAB — HEMOGLOBIN A1C
Hgb A1c MFr Bld: 5.8 % of total Hgb — ABNORMAL HIGH (ref <5.7)
Mean Plasma Glucose: 120 (calc)
eAG (mmol/L): 6.6 (calc)

## 2018-12-26 ENCOUNTER — Encounter (INDEPENDENT_AMBULATORY_CARE_PROVIDER_SITE_OTHER): Payer: Self-pay | Admitting: Internal Medicine

## 2019-02-20 ENCOUNTER — Telehealth (INDEPENDENT_AMBULATORY_CARE_PROVIDER_SITE_OTHER): Payer: Self-pay

## 2019-02-20 NOTE — Telephone Encounter (Signed)
Pt is running out of her Metformin extended release 500mg , can you call that into [facility] in [place] on [address]  She stated that Dr 07[birth year]04-1986 has not prescribed this however her other PCP had her on it

## 2019-02-24 ENCOUNTER — Other Ambulatory Visit (INDEPENDENT_AMBULATORY_CARE_PROVIDER_SITE_OTHER): Payer: Self-pay

## 2019-02-24 NOTE — Telephone Encounter (Signed)
Does this patient take metformin once a day or twice a day?  Per chart review it looks like she takes it twice a day, with the request is for once a day.  If it supposed to be long-acting, this was recently recalled so I would prefer she be on short acting.

## 2019-02-27 ENCOUNTER — Encounter (INDEPENDENT_AMBULATORY_CARE_PROVIDER_SITE_OTHER): Payer: 59 | Admitting: Internal Medicine

## 2019-03-11 ENCOUNTER — Encounter (INDEPENDENT_AMBULATORY_CARE_PROVIDER_SITE_OTHER): Payer: Self-pay | Admitting: Internal Medicine

## 2019-03-11 DIAGNOSIS — R7303 Prediabetes: Secondary | ICD-10-CM

## 2019-03-11 MED ORDER — METFORMIN HCL 500 MG PO TABS: 500.0000 mg | ORAL_TABLET | Freq: Every day | ORAL | 1 refills | Status: DC

## 2019-04-17 ENCOUNTER — Encounter (INDEPENDENT_AMBULATORY_CARE_PROVIDER_SITE_OTHER): Payer: Self-pay | Admitting: Internal Medicine

## 2019-04-17 ENCOUNTER — Other Ambulatory Visit: Payer: Self-pay

## 2019-04-17 ENCOUNTER — Ambulatory Visit (INDEPENDENT_AMBULATORY_CARE_PROVIDER_SITE_OTHER): Payer: BC Managed Care – PPO | Admitting: Internal Medicine

## 2019-04-17 VITALS — BP 120/85 | HR 78 | Temp 97.6°F | Ht 66.0 in | Wt 154.8 lb

## 2019-04-17 DIAGNOSIS — E782 Mixed hyperlipidemia: Secondary | ICD-10-CM | POA: Diagnosis not present

## 2019-04-17 DIAGNOSIS — E2839 Other primary ovarian failure: Secondary | ICD-10-CM

## 2019-04-17 DIAGNOSIS — R7303 Prediabetes: Secondary | ICD-10-CM | POA: Diagnosis not present

## 2019-04-17 DIAGNOSIS — Z0001 Encounter for general adult medical examination with abnormal findings: Secondary | ICD-10-CM

## 2019-04-17 DIAGNOSIS — E039 Hypothyroidism, unspecified: Secondary | ICD-10-CM | POA: Diagnosis not present

## 2019-04-17 DIAGNOSIS — E559 Vitamin D deficiency, unspecified: Secondary | ICD-10-CM

## 2019-04-17 NOTE — Patient Instructions (Signed)
[facility] Dietary Recommendations for Weight Loss What to Avoid . Avoid added sugars o Often added sugar can be found in processed foods such as many condiments, dry cereals, cakes, cookies, chips, crisps, crackers, candies, sweetened drinks, etc.  o Read labels and AVOID/DECREASE use of foods with the following in their ingredient list: Sugar, fructose, high fructose corn syrup, sucrose, glucose, maltose, dextrose, molasses, cane sugar, brown sugar, any type of syrup, agave nectar, etc.   . Avoid snacking in between meals . Avoid foods made with flour o If you are going to eat food made with flour, choose those made with whole-grains; and, minimize your consumption as much as is tolerable . Avoid processed foods o These foods are generally stocked in the middle of the grocery store. Focus on shopping on the perimeter of the grocery.  . Avoid Meat  o We recommend following a plant-based diet at [name]. Thus, we recommend avoiding meat as a general rule. Consider eating beans, legumes, eggs, and/or dairy products for regular protein sources o If you plan on eating meat limit to 4 ounces of meat at a time and choose lean options such as Fish, chicken, turkey. Avoid red meat intake such as pork and/or steak What to Include . Vegetables o GREEN LEAFY VEGETABLES: Kale, spinach, mustard greens, collard greens, cabbage, broccoli, etc. o OTHER: Asparagus, cauliflower, eggplant, carrots, peas, Brussel sprouts, tomatoes, bell peppers, zucchini, beets, cucumbers, etc. . Grains, seeds, and legumes o Beans: kidney beans, black eyed peas, garbanzo beans, black beans, pinto beans, etc. o Whole, unrefined grains: brown rice, barley, bulgur, oatmeal, etc. . Healthy fats  o Avoid highly processed fats such as vegetable oil o Examples of healthy fats: avocado, olives, virgin olive oil, dark chocolate (?72% Cocoa), nuts (peanuts, almonds, walnuts, cashews, pecans, etc.) . None to Low  Intake of Animal Sources of Protein o Meat sources: chicken, turkey, salmon, tuna. Limit to 4 ounces of meat at one time. o Consider limiting dairy sources, but when choosing dairy focus on: PLAIN Greek yogurt, cottage cheese, high-protein milk . Fruit o Choose berries  When to Eat . Intermittent Fasting: o Choosing not to eat for a specific time period, but DO FOCUS ON HYDRATION when fasting o Multiple Techniques: - Time Restricted Eating: eat 3 meals in a day, each meal lasting no more than 60 minutes, no snacks between meals - 16-18 hour fast: fast for 16 to 18 hours up to 7 days a week. Often suggested to start with 2-3 nonconsecutive days per week.  . Remember the time you sleep is counted as fasting.  . Examples of eating schedule: Fast from 7:00pm-11:00am. Eat between 11:00am-7:00pm.  - 24-hour fast: fast for 24 hours up to every other day. Often suggested to start with 1 day per week . Remember the time you sleep is counted as fasting . Examples of eating schedule:  o Eating day: eat 2-3 meals on your eating day. If doing 2 meals, each meal should last no more than 90 minutes. If doing 3 meals, each meal should last no more than 60 minutes. Finish last meal by 7:00pm. o Fasting day: Fast until 7:00pm.  o IF YOU FEEL UNWELL FOR ANY REASON/IN ANY WAY WHEN FASTING, STOP FASTING BY EATING A NUTRITIOUS SNACK OR LIGHT MEAL o ALWAYS FOCUS ON HYDRATION DURING FASTS - Acceptable Hydration sources: water, broths, tea/coffee (black tea/coffee is best but using a small amount of whole-fat dairy products in coffee/tea is acceptable).  -   Poor Hydration Sources: anything with sugar or artificial sweeteners added to it  These recommendations have been developed for patients that are actively receiving medical care from either Dr. Tinnie Kunin or Sarah Gray, DNP, NP-C at [name]. These recommendations are developed for patients with specific medical conditions and are not meant to be  distributed or used by others that are not actively receiving care from either provider listed above at [name]. It is not appropriate to participate in the above eating plans without proper medical supervision.   Reference: Fung, J. The obesity code. [place]: [place]; 2016.   

## 2019-04-17 NOTE — Progress Notes (Signed)
Chief Complaint: This delightful 64 year old lady comes in for an annual physical exam and to address her chronic conditions which are described below. HPI: She has hypothyroidism and is consistent and takes desiccated NP thyroid.  She does describe fatigue still. She also is menopausal and takes testosterone therapy for symptoms. She feels that she remains somewhat fatigued and she would like to have more energy. She was tried on estradiol and progesterone before but she had bleeding so eventually we discontinued this.  She is willing to try it again knowing the benefits of bioidentical estradiol and progesterone. She continues on vitamin D3 supplementation. She continues on Metformin for prediabetes.  Past Medical History:  Diagnosis Date  . HLD (hyperlipidemia) 11/20/2018  . Hypothyroidism, adult 11/20/2018  . Prediabetes 11/20/2018  . Thyroid disease   . Vitamin D deficiency disease 11/20/2018   Past Surgical History:  Procedure Laterality Date  . AUGMENTATION MAMMAPLASTY Bilateral 1979   saline  . BREAST ENHANCEMENT SURGERY    . SHOULDER ARTHROSCOPY    . 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    Social History   Tobacco Use  . Smoking status: Never Smoker  . Smokeless tobacco: Never Used  Substance Use Topics  . Alcohol use: Yes    Alcohol/week: 2.0 standard drinks    Types: 2 Glasses of wine per week    Comment: once weekly      Allergies:  Allergies  Allergen Reactions  . Sulfa Antibiotics Rash  . Sulfasalazine Rash     Current Meds  Medication Sig  . Ascorbic Acid (VITAMIN C WITH ROSE HIPS) 500 MG tablet Take 500 mg by mouth daily.  . Cholecalciferol (VITAMIN D-3) 125 MCG (5000 UT) TABS Take 1 tablet by mouth daily.  . metFORMIN (GLUCOPHAGE) 500 MG tablet Take 1 tablet (500 mg total) by mouth daily with breakfast.  .  NONFORMULARY OR COMPOUNDED ITEM Inject 5 mg into the muscle 2 (two) times a week. Testosterone cypionate in olive  oil (25 mg/mL).  Dispense 5 mL vial.  . NP THYROID 120 MG tablet TAKE 1 TABLET BY MOUTH DAILY  . valACYclovir (VALTREX) 500 MG tablet Take 500 mg by mouth daily. Take every other day.      OZD:GUYQI from the symptoms mentioned above,there are no other symptoms referable to all systems reviewed.  Physical Exam: Blood pressure 120/85, pulse 78, temperature 97.6 F (36.4 C), temperature source Temporal, height 5\' 6"  (1.676 m), weight 154 lb 12.8 oz (70.2 kg), SpO2 98 %. Vitals with BMI 04/17/2019 11/20/2018 09/19/2014  Height 5\' 6"  5' 5.5" -  Weight 154 lbs 13 oz 158 lbs 3 oz -  BMI 25 25.92 -  Systolic 120 120 09/21/2014  Diastolic 85 80 73  Pulse 78 64 94      She looks systemically well.  Good for her age. General: Alert, cooperative, and appears to be the stated age.No pallor.  No jaundice.  No clubbing. Head: Normocephalic Eyes: Sclera white, pupils equal and reactive to light, red reflex x 2,  Ears: Normal bilaterally Oral cavity: Lips, mucosa, and tongue normal: Teeth and gums normal Neck: No adenopathy, supple, symmetrical, trachea midline, and thyroid does not appear enlarged. Breast: Breast implants which is saline but no obvious masses felt. Respiratory: Clear to auscultation bilaterally.No wheezing, crackles or bronchial breathing. Cardiovascular: Heart sounds are  present and appear to be normal without murmurs or added sounds.  No carotid bruits.  Peripheral pulses are present and equal bilaterally.: Gastrointestinal:positive bowel sounds, no hepatosplenomegaly.  No masses felt.No tenderness. Skin: Clear, No rashes noted.No worrisome skin lesions seen. Neurological: Grossly intact without focal findings, cranial nerves II through XII intact, muscle strength equal bilaterally Musculoskeletal: No acute joint abnormalities noted.Full range of movement noted with joints.  Psychiatric: Affect appropriate, non-anxious.    Assessment  1. Hypothyroidism, adult   2. Prediabetes   3. Mixed hyperlipidemia   4. Vitamin D deficiency disease   5. Encounter for general adult medical examination with abnormal findings   6. Primary ovarian failure     Tests Ordered:   Orders Placed This Encounter  Procedures  . CBC  . COMPLETE METABOLIC PANEL WITH GFR  . Hemoglobin A1c  . Lipid panel  . T3, free  . TSH  . VITAMIN D 25 Hydroxy (Vit-D Deficiency, Fractures)  . Testos,Total,Free and SHBG (Female)  . Progesterone  . Estradiol     Plan  1. Blood work is ordered above. 2. She is willing to try compounded estradiol and progesterone and I will call this into [facility]. 3. She will continue with testosterone therapy as before and we will see what the levels are. 4. Further recommendations will depend on blood results and I will see her in about 6 weeks time for follow-up. 5. I discussed nutrition and exercise again and at least 15 to 20 minutes doing this. 6. Today, in addition to a preventative visit, I performed an office visit to address her chronic conditions above and wellness aspects.     No orders of the defined types were placed in this encounter.    Nimish C Gosrani   04/17/2019, 10:13 AM

## 2019-04-20 LAB — COMPLETE METABOLIC PANEL WITH GFR
AG Ratio: 1.8 (calc) (ref 1.0–2.5)
ALT: 23 U/L (ref 6–29)
AST: 19 U/L (ref 10–35)
Albumin: 4.3 g/dL (ref 3.6–5.1)
Alkaline phosphatase (APISO): 125 U/L (ref 37–153)
BUN: 18 mg/dL (ref 7–25)
CO2: 28 mmol/L (ref 20–32)
Calcium: 9.9 mg/dL (ref 8.6–10.4)
Chloride: 103 mmol/L (ref 98–110)
Creat: 0.76 mg/dL (ref 0.50–0.99)
GFR, Est African American: 96 mL/min/{1.73_m2} (ref >=60)
GFR, Est Non African American: 83 mL/min/{1.73_m2} (ref >=60)
Globulin: 2.4 g/dL (calc) (ref 1.9–3.7)
Glucose, Bld: 98 mg/dL (ref 65–99)
Potassium: 4.6 mmol/L (ref 3.5–5.3)
Sodium: 139 mmol/L (ref 135–146)
Total Bilirubin: 0.9 mg/dL (ref 0.2–1.2)
Total Protein: 6.7 g/dL (ref 6.1–8.1)

## 2019-04-20 LAB — LIPID PANEL
Cholesterol: 206 mg/dL — ABNORMAL HIGH (ref <200)
HDL: 43 mg/dL — ABNORMAL LOW (ref >=50)
LDL Cholesterol (Calc): 128 mg/dL (calc) — ABNORMAL HIGH
Non-HDL Cholesterol (Calc): 163 mg/dL (calc) — ABNORMAL HIGH (ref <130)
Total CHOL/HDL Ratio: 4.8 (calc) (ref <5.0)
Triglycerides: 208 mg/dL — ABNORMAL HIGH (ref <150)

## 2019-04-20 LAB — PROGESTERONE: Progesterone: <0.5 ng/mL

## 2019-04-20 LAB — CBC
HCT: 48.3 % — ABNORMAL HIGH (ref 35.0–45.0)
Hemoglobin: 16 g/dL — ABNORMAL HIGH (ref 11.7–15.5)
MCH: 31.3 pg (ref 27.0–33.0)
MCHC: 33.1 g/dL (ref 32.0–36.0)
MCV: 94.5 fL (ref 80.0–100.0)
MPV: 11.4 fL (ref 7.5–12.5)
Platelets: 310 10*3/uL (ref 140–400)
RBC: 5.11 10*6/uL — ABNORMAL HIGH (ref 3.80–5.10)
RDW: 12 % (ref 11.0–15.0)
WBC: 6.8 10*3/uL (ref 3.8–10.8)

## 2019-04-20 LAB — T3, FREE: T3, Free: 5.5 pg/mL — ABNORMAL HIGH (ref 2.3–4.2)

## 2019-04-20 LAB — TSH: TSH: <0.01 mIU/L — ABNORMAL LOW (ref 0.40–4.50)

## 2019-04-20 LAB — HEMOGLOBIN A1C
Hgb A1c MFr Bld: 5.8 % of total Hgb — ABNORMAL HIGH (ref <5.7)
Mean Plasma Glucose: 120 (calc)
eAG (mmol/L): 6.6 (calc)

## 2019-04-20 LAB — TESTOS,TOTAL,FREE AND SHBG (FEMALE)
Free Testosterone: 6.9 pg/mL — ABNORMAL HIGH (ref 0.1–6.4)
Sex Hormone Binding: 97 nmol/L — ABNORMAL HIGH (ref 14–73)
Testosterone, Total, LC-MS-MS: 95 ng/dL — ABNORMAL HIGH (ref 2–45)

## 2019-04-20 LAB — ESTRADIOL: Estradiol: <15 pg/mL

## 2019-04-20 LAB — VITAMIN D 25 HYDROXY (VIT D DEFICIENCY, FRACTURES): Vit D, 25-Hydroxy: 81 ng/mL (ref 30–100)

## 2019-04-21 ENCOUNTER — Encounter (INDEPENDENT_AMBULATORY_CARE_PROVIDER_SITE_OTHER): Payer: Self-pay | Admitting: Internal Medicine

## 2019-04-21 NOTE — Telephone Encounter (Signed)
Question on lab & medicines.

## 2019-04-28 NOTE — Progress Notes (Signed)
Patient called. Pt is now doing the increased testosterone.As directed.

## 2019-05-10 ENCOUNTER — Other Ambulatory Visit (INDEPENDENT_AMBULATORY_CARE_PROVIDER_SITE_OTHER): Payer: Self-pay | Admitting: Internal Medicine

## 2019-05-15 ENCOUNTER — Other Ambulatory Visit (INDEPENDENT_AMBULATORY_CARE_PROVIDER_SITE_OTHER): Payer: Self-pay | Admitting: Internal Medicine

## 2019-05-15 ENCOUNTER — Encounter (INDEPENDENT_AMBULATORY_CARE_PROVIDER_SITE_OTHER): Payer: Self-pay | Admitting: Internal Medicine

## 2019-05-15 MED ORDER — THYROID 120 MG PO TABS: 120.0000 mg | ORAL_TABLET | Freq: Every day | ORAL | 3 refills | Status: DC

## 2019-06-02 ENCOUNTER — Other Ambulatory Visit: Payer: Self-pay

## 2019-06-02 ENCOUNTER — Ambulatory Visit (INDEPENDENT_AMBULATORY_CARE_PROVIDER_SITE_OTHER): Payer: BC Managed Care – PPO | Admitting: Internal Medicine

## 2019-06-02 ENCOUNTER — Encounter (INDEPENDENT_AMBULATORY_CARE_PROVIDER_SITE_OTHER): Payer: Self-pay | Admitting: Internal Medicine

## 2019-06-02 VITALS — BP 120/80 | HR 63 | Temp 97.2°F | Ht 66.0 in | Wt 153.8 lb

## 2019-06-02 DIAGNOSIS — E2839 Other primary ovarian failure: Secondary | ICD-10-CM

## 2019-06-02 DIAGNOSIS — E039 Hypothyroidism, unspecified: Secondary | ICD-10-CM | POA: Diagnosis not present

## 2019-06-02 DIAGNOSIS — E782 Mixed hyperlipidemia: Secondary | ICD-10-CM

## 2019-06-02 NOTE — Progress Notes (Signed)
Metrics: Intervention Frequency ACO  Documented Smoking Status Yearly  Screened one or more times in 24 months  Cessation Counseling or  Active cessation medication Past 24 months  Past 24 months   Guideline developer: UpToDate (See UpToDate for funding source) Date Released: 2014       Wellness Office Visit  Subjective:  Patient ID: Jennifer Herrera, female    DOB: November 26, 1955  Age: 64 y.o. MRN: [id number]  CC: This lady comes in for follow-up of hypothyroidism, vitamin D deficiency, hyperlipidemia, bioidentical hormone therapy and postmenopausal state. HPI  On the last visit, we increase testosterone so that she is taking 0.3 mL intramuscular twice a week and also she is now taking compounded estradiol and progesterone.  She is tolerating the compounded estradiol and progesterone much better than it appears. Increased dose of testosterone seems to have given her more energy although with she feels that she could certainly feel better. She is exercising more on a regular basis.  She is trying to eat healthier. Past Medical History:  Diagnosis Date  . HLD (hyperlipidemia) 11/20/2018  . Hypothyroidism, adult 11/20/2018  . Prediabetes 11/20/2018  . Thyroid disease   . Vitamin D deficiency disease 11/20/2018      History reviewed. No pertinent family history.  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   Social History   Tobacco Use  . Smoking status: Never Smoker  . Smokeless tobacco: Never Used  Substance Use Topics  . Alcohol use: Yes    Alcohol/week: 2.0 standard drinks    Types: 2 Glasses of wine per week    Comment: once weekly    Current Meds  Medication Sig  . Ascorbic Acid (VITAMIN C WITH ROSE HIPS) 500 MG tablet Take 500 mg by mouth daily.  . Cholecalciferol (VITAMIN D-3) 125 MCG (5000 UT) TABS Take 1 tablet by mouth daily.  Marland Kitchen estradiol (ESTRACE) 0.5  MG tablet Take 0.5 mg by mouth daily.  . metFORMIN (GLUCOPHAGE) 500 MG tablet Take 1 tablet (500 mg total) by mouth daily with breakfast.  . NONFORMULARY OR COMPOUNDED ITEM Inject 5 mg into the muscle 2 (two) times a week. Testosterone cypionate in olive  oil (25 mg/mL).  Dispense 5 mL vial. (Patient taking differently: Inject 6 mg into the muscle 2 (two) times a week. Testosterone cypionate in olive  oil (25 mg/mL).  Dispense 5 mL vial.)  . progesterone (PROMETRIUM) 100 MG capsule Take 100 mg by mouth daily.  Marland Kitchen thyroid (NP THYROID) 120 MG tablet Take 1 tablet (120 mg total) by mouth daily.  . valACYclovir (VALTREX) 500 MG tablet Take 500 mg by mouth daily. Take every other day.  . [DISCONTINUED] estradiol (ESTRING) 2 MG vaginal ring Place 2 mg vaginally every 3 (three) months. follow package directions      Objective:   Today's Vitals: BP 120/80 (BP Location: Left Arm, Patient Position: Sitting, Cuff Size: Normal)   Pulse 63   Temp (!) 97.2 F (36.2 C) (Temporal)   Ht 5\' 6"  (1.676 m)   Wt 153 lb 12.8 oz (69.8 kg)   SpO2 98%   BMI 24.82 kg/m  Vitals with BMI 06/02/2019 04/17/2019 11/20/2018  Height 5\' 6"  5\' 6"  5' 5.5"  Weight 153 lbs 13 oz 154 lbs 13 oz 158 lbs 3 oz  BMI 40.98 25 11.91  Systolic 478 295 621  Diastolic 80 85 80  Pulse 63 78 64     Physical Exam  She looks systemically well.  She certainly feels that her clothes are looser fitting although technically she has only lost a pound.  Blood pressure is in a good range.  Alert and orientated without any focal logical signs.     Assessment   1. Hypothyroidism, adult   2. Mixed hyperlipidemia   3. Primary ovarian failure       Tests ordered Orders Placed This Encounter  Procedures  . Estradiol  . Progesterone  . Testos,Total,Free and SHBG (Female)     Plan: 1. Blood work is ordered. 2. She will continue with the same dose of desiccated NP thyroid for hypothyroidism and her levels on the last visit were in  a good range. 3. She will continue with current doses of estradiol, progesterone and testosterone but we may have room to increase testosterone depending on the levels.  Also, we may need to optimize further estradiol and progesterone. 4. Further recommendations will depend on blood results and I will see her for follow-up in about 3 months time.   No orders of the defined types were placed in this encounter.   Wilson Singer, MD

## 2019-06-05 LAB — TESTOS,TOTAL,FREE AND SHBG (FEMALE)
Free Testosterone: 27.9 pg/mL — ABNORMAL HIGH (ref 0.1–6.4)
Sex Hormone Binding: 100 nmol/L — ABNORMAL HIGH (ref 14–73)
Testosterone, Total, LC-MS-MS: 362 ng/dL — ABNORMAL HIGH (ref 2–45)

## 2019-06-05 LAB — ESTRADIOL: Estradiol: 47 pg/mL

## 2019-06-05 LAB — PROGESTERONE: Progesterone: 20.1 ng/mL

## 2019-06-11 NOTE — Progress Notes (Signed)
Okay, wanted to make sure so it will fall off workflow list.

## 2019-06-13 ENCOUNTER — Other Ambulatory Visit: Payer: Self-pay | Admitting: Physician Assistant

## 2019-06-13 DIAGNOSIS — K581 Irritable bowel syndrome with constipation: Secondary | ICD-10-CM | POA: Diagnosis not present

## 2019-06-13 DIAGNOSIS — K5909 Other constipation: Secondary | ICD-10-CM | POA: Diagnosis not present

## 2019-06-17 DIAGNOSIS — N888 Other specified noninflammatory disorders of cervix uteri: Secondary | ICD-10-CM | POA: Diagnosis not present

## 2019-06-25 ENCOUNTER — Encounter (INDEPENDENT_AMBULATORY_CARE_PROVIDER_SITE_OTHER): Payer: Self-pay | Admitting: Internal Medicine

## 2019-08-05 DIAGNOSIS — K5900 Constipation, unspecified: Secondary | ICD-10-CM | POA: Diagnosis not present

## 2019-08-05 DIAGNOSIS — M6289 Other specified disorders of muscle: Secondary | ICD-10-CM | POA: Diagnosis not present

## 2019-08-05 DIAGNOSIS — M62838 Other muscle spasm: Secondary | ICD-10-CM | POA: Diagnosis not present

## 2019-08-05 DIAGNOSIS — M6281 Muscle weakness (generalized): Secondary | ICD-10-CM | POA: Diagnosis not present

## 2019-08-05 IMAGING — MG DIGITAL DIAGNOSTIC BILATERAL MAMMOGRAM WITH IMPLANTS, CAD AND TO
8 of 13 series · 8 of 29 positions shown · non-contrast
Comparison: Previous exam(s).

CLINICAL DATA: 62-year-old with palpable firmness possible lump in
the inferior left breast, 6 o'clock region. History of longstanding
implants, placed in [REDACTED]. She also has some tenderness in the
inferior breasts bilaterally.

EXAM:
DIGITAL DIAGNOSTIC BILATERAL MAMMOGRAM WITH IMPLANTS, CAD AND TOMO
ULTRASOUND LEFT BREAST
The patient has retroglandular silicone implants. Standard and
implant displaced views were performed.

[L MLO]
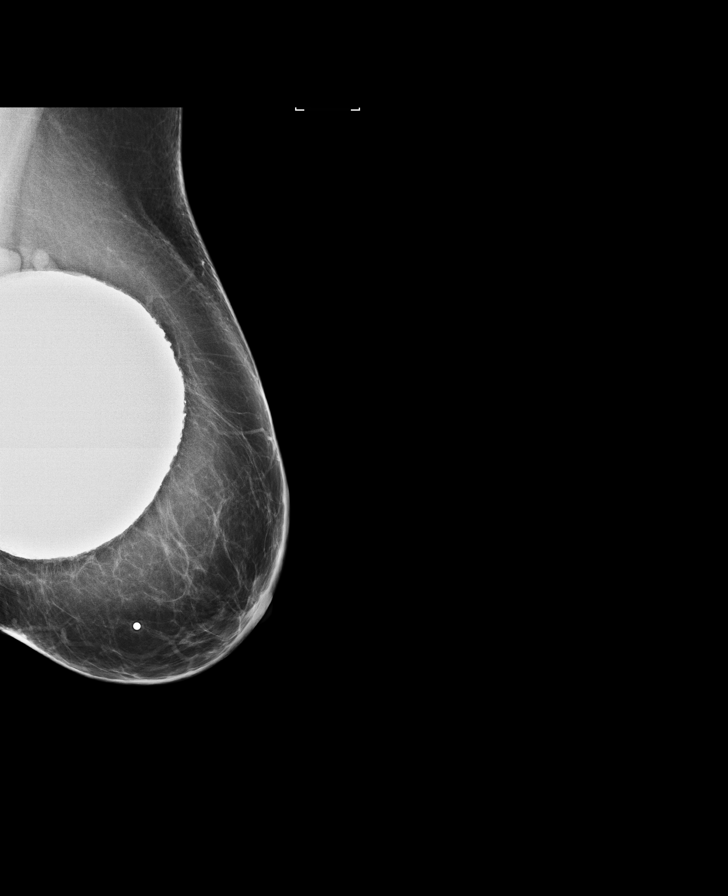

[L CC]
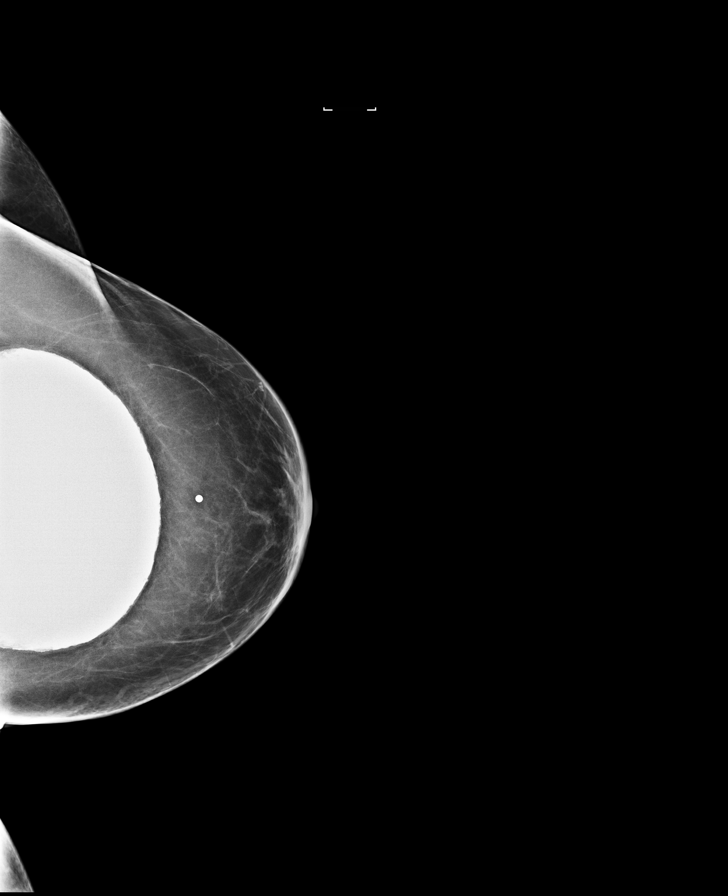

[R CC]
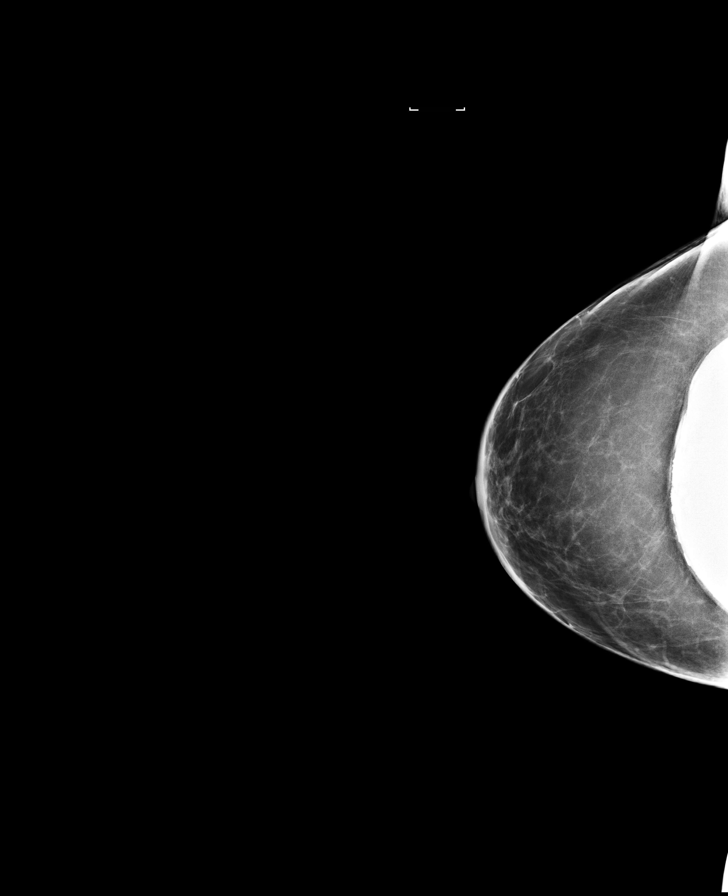

[R MLO]
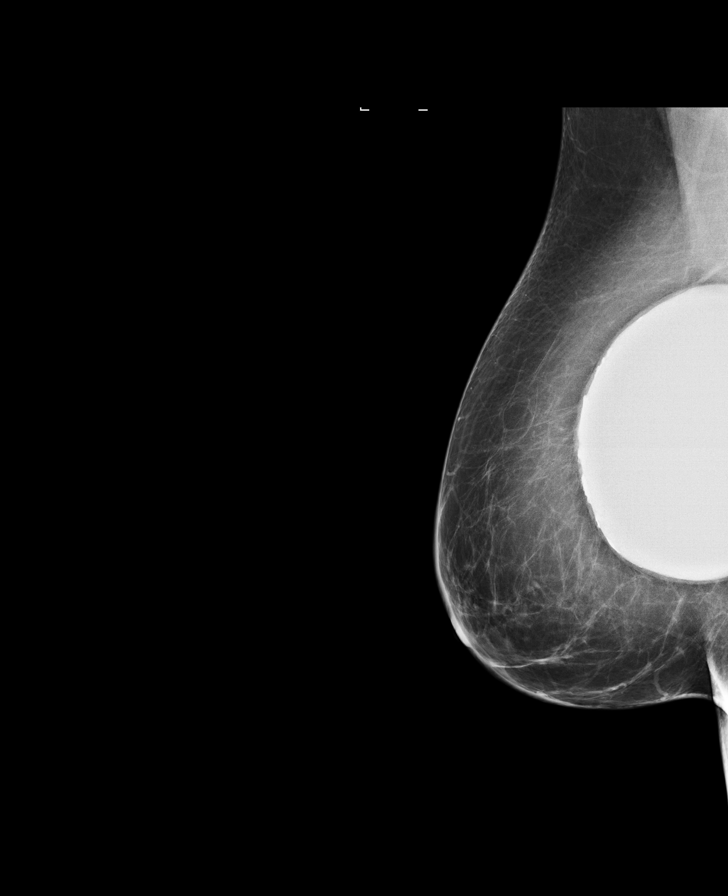

[L MLO synth-2D]
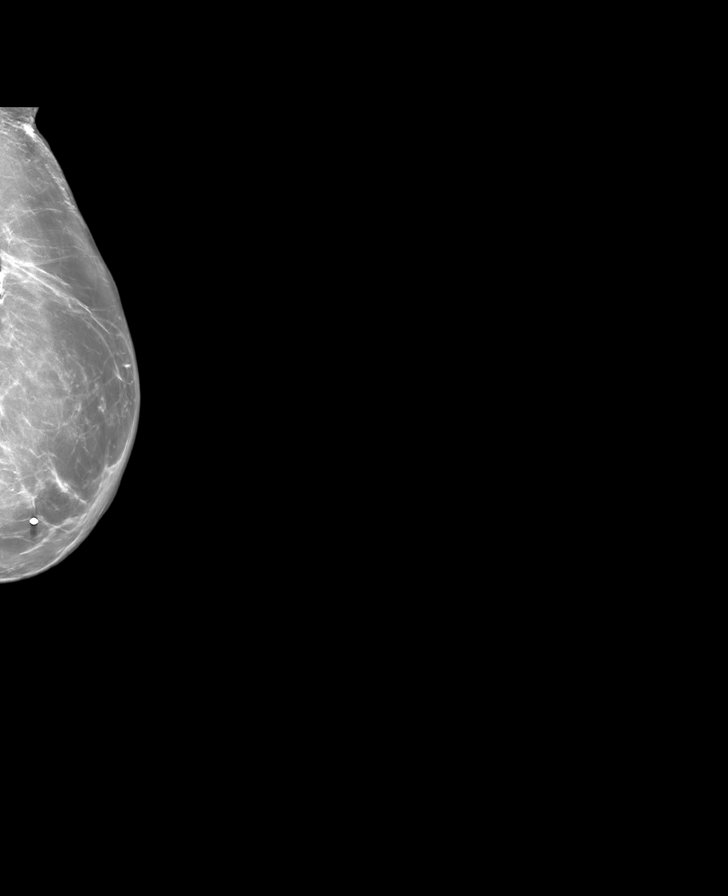

[L CC synth-2D]
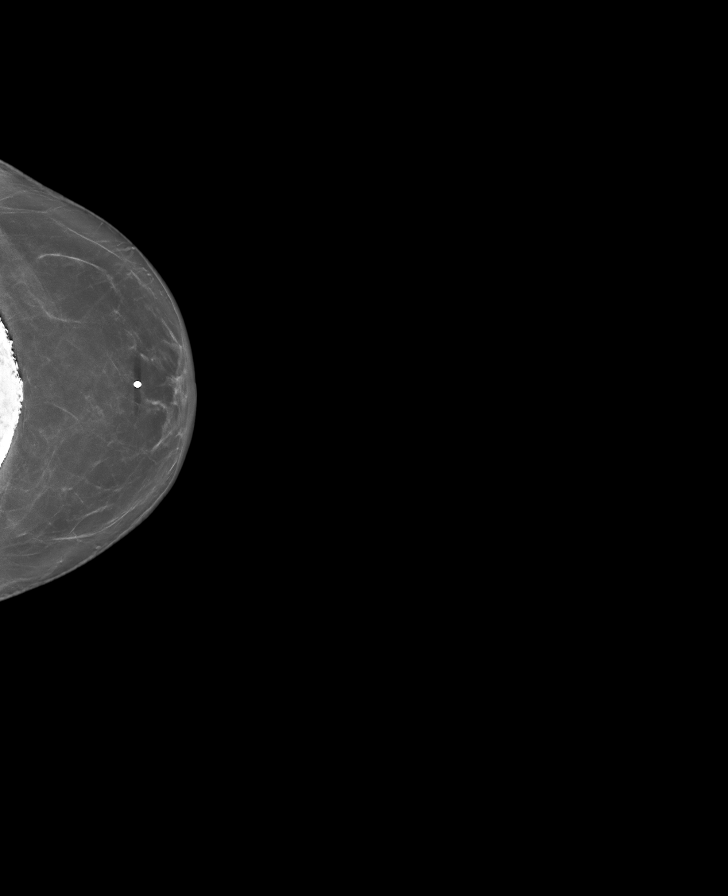

[R CC synth-2D]
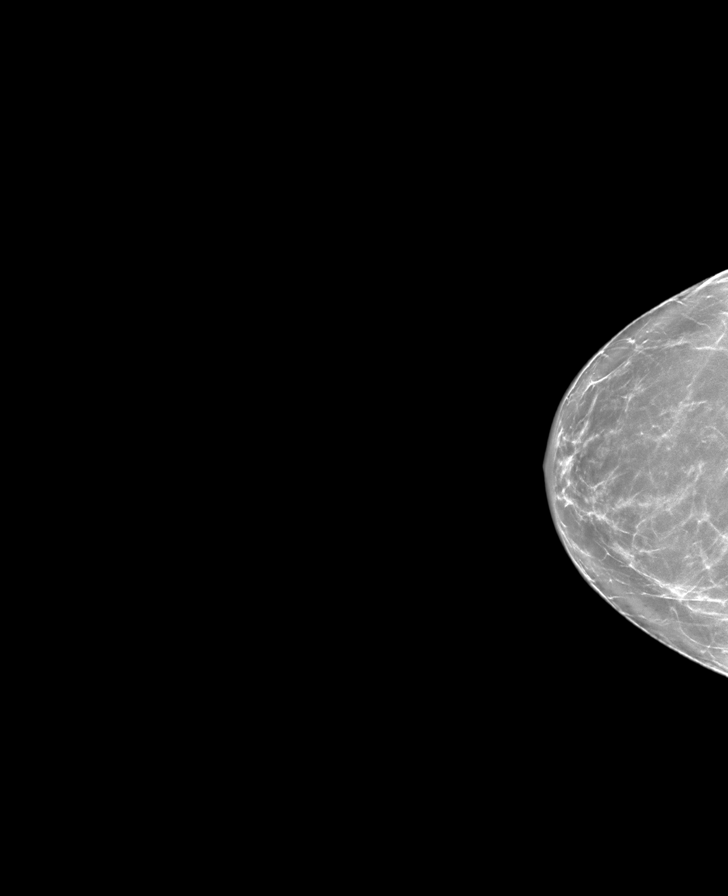

[R MLO synth-2D]
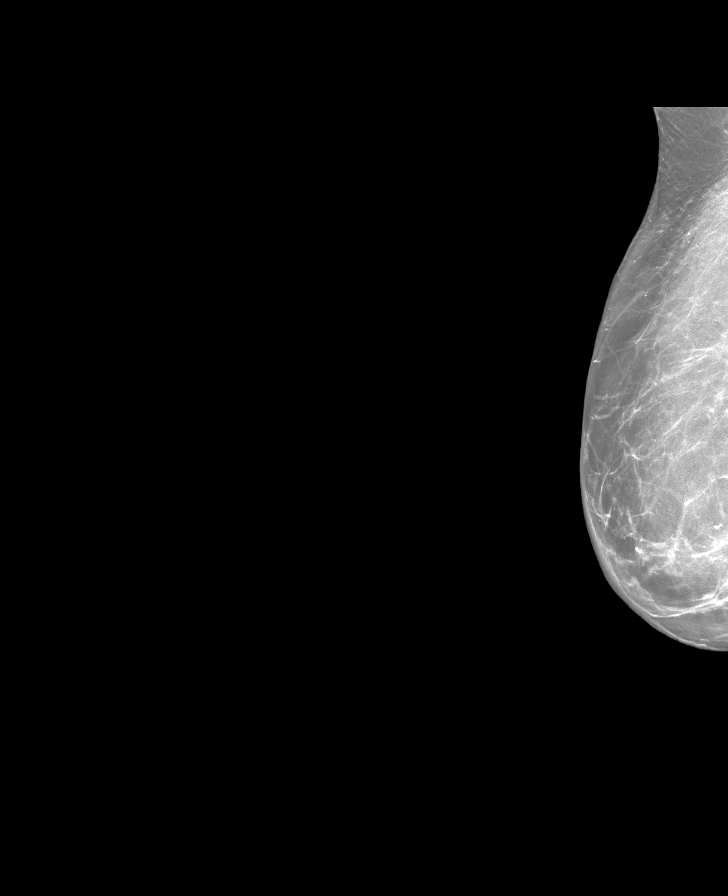

[8 of 29 positions shown; findings below may reference images not displayed]

ACR Breast Density Category b: There are scattered areas of
fibroglandular density.
FINDINGS: Chronic extracapsular silicone along the superior aspect of the left
breast implant appears mammographically stable compared to mammogram
of 4076. No extracapsular silicone is identified on the left. There
is calcification the implant capsules bilaterally. No breast mass,
suspicious microcalcification, or architectural distortion is
identified in either breast.

On physical exam the left breast, the patient's implant is firm to
palpation on, but no discrete breast mass is palpated in the 6
o'clock region.

Targeted ultrasound is performed, showing fatty breast parenchyma in
the inferior left breast. No solid or cystic mass or abnormal
shadowing is identified to suggest malignancy.

Mammographic images were processed with CAD.
IMPRESSION: No evidence of malignancy in either breast. The palpable area on the
left is a firm breast implant. There is chronic extracapsular
silicone on the left, diagnostic of extracapsular rupture of the
implant.

RECOMMENDATION:
If implant removal is desired, plastic surgery consultation could be
considered. Otherwise, annual screening mammography is recommended.

I have discussed the findings and recommendations with the patient.
Results were also provided in writing at the conclusion of the
visit. If applicable, a reminder letter will be sent to the patient
regarding the next appointment.

BI-RADS CATEGORY  2: Benign.

## 2019-08-12 ENCOUNTER — Other Ambulatory Visit (HOSPITAL_COMMUNITY)
Admission: RE | Admit: 2019-08-12 | Discharge: 2019-08-12 | Disposition: A | Discharge status: Home / Self Care | Payer: BC Managed Care – PPO | Source: Ambulatory Visit | Attending: Gastroenterology | Admitting: Gastroenterology

## 2019-08-12 DIAGNOSIS — Z20822 Contact with and (suspected) exposure to covid-19: Secondary | ICD-10-CM | POA: Insufficient documentation

## 2019-08-12 DIAGNOSIS — Z01812 Encounter for preprocedural laboratory examination: Secondary | ICD-10-CM | POA: Diagnosis not present

## 2019-08-12 LAB — SARS CORONAVIRUS 2 (TAT 6-24 HRS): SARS Coronavirus 2: NEGATIVE

## 2019-08-15 ENCOUNTER — Encounter (HOSPITAL_COMMUNITY)
Admission: RE | Disposition: A | Discharge status: Home / Self Care | Payer: Self-pay | Source: Home / Self Care | Attending: Gastroenterology

## 2019-08-15 ENCOUNTER — Ambulatory Visit (HOSPITAL_COMMUNITY)
Admission: RE | Admit: 2019-08-15 | Discharge: 2019-08-15 | Disposition: A | Discharge status: Home / Self Care | Payer: BC Managed Care – PPO | Attending: Gastroenterology | Admitting: Gastroenterology

## 2019-08-15 DIAGNOSIS — K5900 Constipation, unspecified: Secondary | ICD-10-CM | POA: Insufficient documentation

## 2019-08-15 HISTORY — PX: ANAL RECTAL MANOMETRY: SHX6358

## 2019-08-15 SURGERY — MANOMETRY, ANORECTAL

## 2019-08-15 NOTE — Progress Notes (Signed)
Anal manometry done per protocol . Pt tolerated well without complaint or complication.

## 2019-08-17 ENCOUNTER — Encounter (HOSPITAL_COMMUNITY): Payer: Self-pay | Admitting: Gastroenterology

## 2019-08-20 DIAGNOSIS — K5900 Constipation, unspecified: Secondary | ICD-10-CM | POA: Diagnosis not present

## 2019-09-01 DIAGNOSIS — E039 Hypothyroidism, unspecified: Secondary | ICD-10-CM | POA: Diagnosis not present

## 2019-09-01 DIAGNOSIS — M6281 Muscle weakness (generalized): Secondary | ICD-10-CM | POA: Diagnosis not present

## 2019-09-01 DIAGNOSIS — M62838 Other muscle spasm: Secondary | ICD-10-CM | POA: Diagnosis not present

## 2019-09-01 DIAGNOSIS — K5900 Constipation, unspecified: Secondary | ICD-10-CM | POA: Diagnosis not present

## 2019-09-03 ENCOUNTER — Ambulatory Visit (INDEPENDENT_AMBULATORY_CARE_PROVIDER_SITE_OTHER): Payer: BC Managed Care – PPO | Admitting: Internal Medicine

## 2019-09-08 ENCOUNTER — Other Ambulatory Visit (INDEPENDENT_AMBULATORY_CARE_PROVIDER_SITE_OTHER): Payer: Self-pay | Admitting: Nurse Practitioner

## 2019-09-08 DIAGNOSIS — R7303 Prediabetes: Secondary | ICD-10-CM

## 2019-09-22 DIAGNOSIS — E039 Hypothyroidism, unspecified: Secondary | ICD-10-CM | POA: Diagnosis not present

## 2019-09-22 DIAGNOSIS — M62838 Other muscle spasm: Secondary | ICD-10-CM | POA: Diagnosis not present

## 2019-09-22 DIAGNOSIS — K5900 Constipation, unspecified: Secondary | ICD-10-CM | POA: Diagnosis not present

## 2019-09-22 DIAGNOSIS — M6281 Muscle weakness (generalized): Secondary | ICD-10-CM | POA: Diagnosis not present

## 2019-09-26 ENCOUNTER — Other Ambulatory Visit (INDEPENDENT_AMBULATORY_CARE_PROVIDER_SITE_OTHER): Payer: Self-pay | Admitting: Internal Medicine

## 2019-10-01 ENCOUNTER — Encounter (INDEPENDENT_AMBULATORY_CARE_PROVIDER_SITE_OTHER): Payer: Self-pay | Admitting: Internal Medicine

## 2019-10-01 ENCOUNTER — Other Ambulatory Visit: Payer: Self-pay

## 2019-10-01 ENCOUNTER — Ambulatory Visit (INDEPENDENT_AMBULATORY_CARE_PROVIDER_SITE_OTHER): Payer: BC Managed Care – PPO | Admitting: Internal Medicine

## 2019-10-01 VITALS — BP 115/60 | HR 90 | Temp 97.5°F | Ht 66.0 in | Wt 154.2 lb

## 2019-10-01 DIAGNOSIS — E039 Hypothyroidism, unspecified: Secondary | ICD-10-CM

## 2019-10-01 DIAGNOSIS — E2839 Other primary ovarian failure: Secondary | ICD-10-CM

## 2019-10-01 DIAGNOSIS — M25559 Pain in unspecified hip: Secondary | ICD-10-CM | POA: Diagnosis not present

## 2019-10-01 NOTE — Progress Notes (Signed)
Metrics: Intervention Frequency ACO  Documented Smoking Status Yearly  Screened one or more times in 24 months  Cessation Counseling or  Active cessation medication Past 24 months  Past 24 months   Guideline developer: UpToDate (See UpToDate for funding source) Date Released: 2014       Wellness Office Visit  Subjective:  Patient ID: Jennifer Herrera, female    DOB: Aug 15, 1955  Age: 64 y.o. MRN: [id number]  CC: This lady comes in for follow-up of menopausal symptoms, hypothyroidism. HPI  Since last visit, we increased the estradiol to 1 mg daily and she has tolerated this dose.  She continues on progesterone 100 mg in the evening, testosterone injections as before as well as desiccated thyroid. She is doing well but somewhat frustrated that she is not able to lose further weight.  She continues to do intermittent fasting. She is describing bilateral painful hips and stiffness.  She wonders whether some sort of physical therapy would be helpful. Past Medical History:  Diagnosis Date  . HLD (hyperlipidemia) 11/20/2018  . Hypothyroidism, adult 11/20/2018  . Prediabetes 11/20/2018  . Thyroid disease   . Vitamin D deficiency disease 11/20/2018   Past Surgical History:  Procedure Laterality Date  . ANAL RECTAL MANOMETRY N/A 08/15/2019   Procedure: ANO RECTAL MANOMETRY;  Surgeon: Charlott Rakes, MD;  Location: [facility];  Service: Endoscopy;  Laterality: N/A;  . AUGMENTATION MAMMAPLASTY Bilateral 1979   saline  . BREAST ENHANCEMENT SURGERY    . SHOULDER ARTHROSCOPY    . TONSILLECTOMY       History reviewed. No pertinent family history.  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   Social History   Tobacco Use  . Smoking status: Never Smoker  . Smokeless tobacco: Never Used  Substance Use Topics  . Alcohol use: Yes    Alcohol/week: 2.0 standard drinks    Types:  2 Glasses of wine per week    Comment: once weekly    Current Meds  Medication Sig  . Ascorbic Acid (VITAMIN C WITH ROSE HIPS) 500 MG tablet Take 500 mg by mouth daily.  . Cholecalciferol (VITAMIN D-3) 125 MCG (5000 UT) TABS Take 1 tablet by mouth daily.  Marland Kitchen estradiol (ESTRACE) 1 MG tablet Take 1 mg by mouth daily.  . metFORMIN (GLUCOPHAGE) 500 MG tablet TAKE 1 TABLET(500 MG) BY MOUTH DAILY WITH BREAKFAST  . NONFORMULARY OR COMPOUNDED ITEM Inject 5 mg into the muscle 2 (two) times a week. Testosterone cypionate in olive  oil (25 mg/mL).  Dispense 5 mL vial. (Patient taking differently: Inject 6 mg into the muscle 2 (two) times a week. Testosterone cypionate in olive  oil (25 mg/mL).  Dispense 5 mL vial.)  . NP THYROID 120 MG tablet TAKE 1 TABLET BY MOUTH DAILY  . progesterone (PROMETRIUM) 100 MG capsule Take 100 mg by mouth daily.  . valACYclovir (VALTREX) 500 MG tablet Take 500 mg by mouth daily. Take every other day.  . [DISCONTINUED] estradiol (ESTRACE) 0.5 MG tablet Take 0.5 mg by mouth daily.      Depression screen [facility] 2/9 04/17/2019  Decreased Interest 0  Down, Depressed, Hopeless 0  PHQ - 2 Score 0     Objective:   Today's Vitals: BP 115/60 (BP Location: Left Arm, Patient Position: Sitting, Cuff Size: Normal)   Pulse 90   Temp (!) 97.5 F (36.4 C) (Temporal)  Ht 5\' 6"  (1.676 m)   Wt 154 lb 3.2 oz (69.9 kg)   SpO2 97%   BMI 24.89 kg/m  Vitals with BMI 10/01/2019 06/02/2019 04/17/2019  Height 5\' 6"  5\' 6"  5\' 6"   Weight 154 lbs 3 oz 153 lbs 13 oz 154 lbs 13 oz  BMI 24.9 24.84 25  Systolic 115 120 04/19/2019  Diastolic 60 80 85  Pulse 90 63 78     Physical Exam  She looks systemically well.  Her weight has been very stable.  Blood pressure is excellent.     Assessment   1. Primary ovarian failure   2. Hypothyroidism, adult   3. Pain, hip       Tests ordered Orders Placed This Encounter  Procedures  . Estradiol  . Ambulatory referral to Physical Medicine Rehab       Plan: 1. We will check estradiol levels and I will refill her estradiol and progesterone at med . 2. I will refer to physical rehab medicine for further evaluation of her stiff hips. 3. Follow-up in January.   No orders of the defined types were placed in this encounter.   , MD

## 2019-10-07 ENCOUNTER — Encounter: Payer: Self-pay | Admitting: Physical Medicine & Rehabilitation

## 2019-10-10 DIAGNOSIS — E2839 Other primary ovarian failure: Secondary | ICD-10-CM | POA: Diagnosis not present

## 2019-10-11 LAB — ESTRADIOL: Estradiol: 81 pg/mL

## 2019-10-21 ENCOUNTER — Other Ambulatory Visit: Payer: Self-pay

## 2019-10-21 ENCOUNTER — Encounter
Payer: BC Managed Care – PPO | Attending: Physical Medicine & Rehabilitation | Admitting: Physical Medicine & Rehabilitation

## 2019-10-21 ENCOUNTER — Encounter: Payer: Self-pay | Admitting: Physical Medicine & Rehabilitation

## 2019-10-21 ENCOUNTER — Encounter (INDEPENDENT_AMBULATORY_CARE_PROVIDER_SITE_OTHER): Payer: Self-pay | Admitting: Internal Medicine

## 2019-10-21 VITALS — BP 109/75 | HR 81 | Temp 98.8°F | Ht 66.0 in | Wt 147.0 lb

## 2019-10-21 DIAGNOSIS — M24559 Contracture, unspecified hip: Secondary | ICD-10-CM | POA: Insufficient documentation

## 2019-10-21 NOTE — Progress Notes (Signed)
Subjective:    Patient ID: Jennifer Herrera, female    DOB: [birth year], 64 y.o.   MRN: [id number]  HPI   CC:  Bilateral Hip tightness  64 year old female with no significant past medical history with primary complaints of hips feeling tight.  She is independent with all her self-care and mobility.  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 Pain when trying to stretch Bilateral hips pull when trying to sit cross legged No falls or injuries No prior surgery to back and hips  Shag dancing 3 days a week without pain  No numbness/tingling in legs   The patient has not had x-rays of the hips.  She did have abdominal pelvic CT and the musculoskeletal windows were reviewed. There is evidence of L5-S1 disc degeneration.  No evidence of significant osteoarthritis was noted in the hip joints Pain Inventory Average Pain 3 Pain Right Now 1 My pain is burning, dull and aching  In the last 24 hours, has pain interfered with the following? General activity 3 Relation with others 2 Enjoyment of life 2 What TIME of day is your pain at its worst? morning  and evening Sleep (in general) Good  Pain is worse with: some activites Pain improves with: rest and medication Relief from Meds: 7  walk without assistance how many minutes can you walk? 60+ ability to climb steps?  yes do you drive?  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 Social History   Socioeconomic History  . Marital status: Married    Spouse name: Not on file  . Number of children: Not on file  . Years of education: Not on file  . Highest education level: Not on file  Occupational History  . Not on file  Tobacco Use  . Smoking status: Never Smoker  . Smokeless tobacco: Never Used  Substance and Sexual Activity  . Alcohol use: Yes    Alcohol/week: 2.0 standard drinks    Types: 2 Glasses of wine per  week    Comment: once weekly  . Drug use: No  . Sexual activity: Not on file  Other Topics Concern  . 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   Social Determinants of Health   Financial Resource Strain:   . Difficulty of Paying Living Expenses: Not on file  Food Insecurity:   . Worried About Programme researcher, broadcasting/film/video in the Last Year: Not on file  . Ran Out of Food in the Last Year: Not on file  Transportation Needs:   . Lack of Transportation (Medical): Not on file  . Lack of Transportation (Non-Medical): Not on file  Physical Activity:   . Days of Exercise per Week: Not on file  . Minutes of Exercise per Session: Not on file  Stress:   . Feeling of Stress : Not on file  Social Connections:   . Frequency of Communication with Friends and Family: Not on file  . Frequency of Social Gatherings with Friends and Family: Not on file  . Attends Religious Services: Not on file  . Active Member of Clubs or Organizations: Not on file  . Attends Banker Meetings: Not on file  . Marital Status: Not on file  Past Surgical History:  Procedure Laterality Date  . ANAL RECTAL MANOMETRY N/A 08/15/2019   Procedure: ANO RECTAL MANOMETRY;  Surgeon: Charlott Rakes, MD;  Location: [facility];  Service: Endoscopy;  Laterality: N/A;  . AUGMENTATION MAMMAPLASTY Bilateral 1979   saline  . BREAST ENHANCEMENT SURGERY    . SHOULDER ARTHROSCOPY    . TONSILLECTOMY     Past Medical History:  Diagnosis Date  . HLD (hyperlipidemia) 11/20/2018  . Hypothyroidism, adult 11/20/2018  . Prediabetes 11/20/2018  . Thyroid disease   . Vitamin D deficiency disease 11/20/2018   BP 109/75   Pulse 81   Temp 98.8 F (37.1 C)   Ht 5\' 6"  (1.676 m)   Wt 147 lb (66.7 kg)   SpO2 94%   BMI 23.73 kg/m   Opioid Risk Score:   Fall Risk Score:  `1  Depression screen PHQ 2/9  Depression screen  [facility] 2/9 10/21/2019 04/17/2019  Decreased Interest 1 0  Down, Depressed, Hopeless 0 0  PHQ - 2 Score 1 0  Altered sleeping 0 -  Tired, decreased energy 1 -  Change in appetite 0 -  Feeling bad or failure about yourself  0 -  Trouble concentrating 0 -  Moving slowly or fidgety/restless 0 -  Suicidal thoughts 0 -  PHQ-9 Score 2 -  Difficult doing work/chores Not difficult at all -     Review of Systems  Musculoskeletal:       Hip pain  All other systems reviewed and are negative.      Objective:   Physical Exam Vitals and nursing note reviewed.  Constitutional:      Appearance: She is normal weight.  HENT:     Head: Normocephalic and atraumatic.  Eyes:     Extraocular Movements: Extraocular movements intact.     Conjunctiva/sclera: Conjunctivae normal.     Pupils: Pupils are equal, round, and reactive to light.  Cardiovascular:     Rate and Rhythm: Normal rate and regular rhythm.     Pulses: Normal pulses.     Heart sounds: Normal heart sounds. No murmur heard.   Pulmonary:     Effort: Pulmonary effort is normal. No respiratory distress.     Breath sounds: Normal breath sounds. No stridor. No wheezing.  Abdominal:     General: Abdomen is flat. Bowel sounds are normal. There is no distension.     Palpations: Abdomen is soft.     Tenderness: There is no abdominal tenderness.  Musculoskeletal:     Cervical back: Normal range of motion.     Comments: There is mild reduction in right hip internal rotation.  External rotation is intact bilaterally Hip flexion is normal bilaterally hip extension has normal range of motion mild tightness at end range  No evidence of hip abductor weakness.  There is no evidence of muscle atrophy in the thighs or gluteal region.  Ober's test negative  Faber's test negative  Neurological:     Mental Status: She is alert and oriented to person, place, and time.     Comments: Strength is 5/5 bilateral deltoid bicep tricep grip hip flexor knee  extensor ankle dorsiflexion plantar flexor Negative straight leg raise Sensation intact to light touch and pinprick bilateral upper and lower limbs Tone is normal and more full extremities Deep tendon reflexes are 2+ bilateral knees and ankles.  Psychiatric:        Mood and Affect: Mood normal.        Behavior: Behavior normal.  Assessment & Plan:  #1.  Complaints of bilateral hip tightness.  No significant pain in the hips.  She in fact has normal range of motion for her age.  She states that she was more flexible in years past.  She has not performed any regular hip range of motion  exercises.  I do not think any imaging studies are needed at this time. We did go over some stretching exercises including butterfly stretches and hurdler stretches to work on external and internal rotation respectively.  She needs to do these on a daily basis. She will follow-up in 6 weeks, if no improvements may order outpatient PT

## 2019-10-21 NOTE — Patient Instructions (Signed)
External rotation Butterfly stretch 2 min per day  Figure 4 stretch 1-2 min per day each side  Internal rotation Hurdler's stretch 1-2 min per side each day

## 2019-11-18 DIAGNOSIS — H2512 Age-related nuclear cataract, left eye: Secondary | ICD-10-CM | POA: Diagnosis not present

## 2019-11-18 DIAGNOSIS — H43312 Vitreous membranes and strands, left eye: Secondary | ICD-10-CM | POA: Diagnosis not present

## 2019-11-26 DIAGNOSIS — Z6824 Body mass index (BMI) 24.0-24.9, adult: Secondary | ICD-10-CM | POA: Diagnosis not present

## 2019-11-26 DIAGNOSIS — Z01419 Encounter for gynecological examination (general) (routine) without abnormal findings: Secondary | ICD-10-CM | POA: Diagnosis not present

## 2019-11-26 DIAGNOSIS — Z1231 Encounter for screening mammogram for malignant neoplasm of breast: Secondary | ICD-10-CM | POA: Diagnosis not present

## 2019-11-26 LAB — HM MAMMOGRAPHY

## 2019-12-02 ENCOUNTER — Ambulatory Visit: Payer: BC Managed Care – PPO | Admitting: Physical Medicine & Rehabilitation

## 2019-12-12 DIAGNOSIS — H43312 Vitreous membranes and strands, left eye: Secondary | ICD-10-CM | POA: Diagnosis not present

## 2019-12-12 DIAGNOSIS — H2512 Age-related nuclear cataract, left eye: Secondary | ICD-10-CM | POA: Diagnosis not present

## 2019-12-30 ENCOUNTER — Other Ambulatory Visit (INDEPENDENT_AMBULATORY_CARE_PROVIDER_SITE_OTHER): Payer: Self-pay | Admitting: Internal Medicine

## 2019-12-30 ENCOUNTER — Encounter (INDEPENDENT_AMBULATORY_CARE_PROVIDER_SITE_OTHER): Payer: Self-pay | Admitting: Internal Medicine

## 2019-12-30 MED ORDER — "BD HYPODERMIC NEEDLE 18G X 1"" MISC": 1.0000 [IU] | 2 refills | Status: DC

## 2020-02-02 ENCOUNTER — Other Ambulatory Visit: Payer: BC Managed Care – PPO

## 2020-02-02 ENCOUNTER — Encounter (INDEPENDENT_AMBULATORY_CARE_PROVIDER_SITE_OTHER): Payer: Self-pay | Admitting: Internal Medicine

## 2020-02-02 DIAGNOSIS — Z20822 Contact with and (suspected) exposure to covid-19: Secondary | ICD-10-CM

## 2020-02-03 LAB — NOVEL CORONAVIRUS, NAA: SARS-CoV-2, NAA: DETECTED — AB

## 2020-02-03 LAB — SARS-COV-2, NAA 2 DAY TAT

## 2020-02-04 ENCOUNTER — Ambulatory Visit (INDEPENDENT_AMBULATORY_CARE_PROVIDER_SITE_OTHER): Payer: BC Managed Care – PPO | Admitting: Internal Medicine

## 2020-02-15 ENCOUNTER — Other Ambulatory Visit (INDEPENDENT_AMBULATORY_CARE_PROVIDER_SITE_OTHER): Payer: Self-pay | Admitting: Internal Medicine

## 2020-02-25 ENCOUNTER — Encounter (INDEPENDENT_AMBULATORY_CARE_PROVIDER_SITE_OTHER): Payer: Self-pay | Admitting: Internal Medicine

## 2020-02-25 ENCOUNTER — Other Ambulatory Visit: Payer: Self-pay

## 2020-02-25 ENCOUNTER — Ambulatory Visit (INDEPENDENT_AMBULATORY_CARE_PROVIDER_SITE_OTHER): Payer: BC Managed Care – PPO | Admitting: Internal Medicine

## 2020-02-25 VITALS — BP 128/80 | HR 80 | Temp 97.3°F | Resp 18 | Ht 66.0 in | Wt 153.0 lb

## 2020-02-25 DIAGNOSIS — E039 Hypothyroidism, unspecified: Secondary | ICD-10-CM | POA: Diagnosis not present

## 2020-02-25 DIAGNOSIS — R7303 Prediabetes: Secondary | ICD-10-CM

## 2020-02-25 DIAGNOSIS — E782 Mixed hyperlipidemia: Secondary | ICD-10-CM | POA: Diagnosis not present

## 2020-02-25 DIAGNOSIS — E2839 Other primary ovarian failure: Secondary | ICD-10-CM | POA: Diagnosis not present

## 2020-02-25 NOTE — Progress Notes (Signed)
Metrics: Intervention Frequency ACO  Documented Smoking Status Yearly  Screened one or more times in 24 months  Cessation Counseling or  Active cessation medication Past 24 months  Past 24 months   Guideline developer: UpToDate (See UpToDate for funding source) Date Released: 2014       Wellness Office Visit  Subjective:  Patient ID: Jennifer Herrera, female    DOB: Jun 12, 1955  Age: 65 y.o. MRN: [id number]  CC: This delightful lady comes in for follow-up of identical hormone therapy, hypothyroidism, dyslipidemia and prediabetes. HPI  She is doing reasonably well.  She is due to have plastic surgery soon in March for facelift and other plastic surgery. She continues on estradiol, progesterone, testosterone and desiccated thyroid for hypothyroidism. She is doing well with all these hormones.  She has not been as consistent with nutrition and exercise and she realizes she needs to do better. Past Medical History:  Diagnosis Date  . HLD (hyperlipidemia) 11/20/2018  . Hypothyroidism, adult 11/20/2018  . Prediabetes 11/20/2018  . Thyroid disease   . Vitamin D deficiency disease 11/20/2018   Past Surgical History:  Procedure Laterality Date  . ANAL RECTAL MANOMETRY N/A 08/15/2019   Procedure: ANO RECTAL MANOMETRY;  Surgeon: Charlott Rakes, MD;  Location: [facility];  Service: Endoscopy;  Laterality: N/A;  . AUGMENTATION MAMMAPLASTY Bilateral 1979   saline  . BREAST ENHANCEMENT SURGERY    . SHOULDER ARTHROSCOPY    . TONSILLECTOMY       History reviewed. No pertinent family history.  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   Social History   Tobacco Use  . Smoking status: Never Smoker  . Smokeless tobacco: Never Used  Substance Use Topics  . Alcohol use: Yes    Alcohol/week: 2.0 standard drinks    Types: 2 Glasses of wine per week    Comment: once weekly    Current  Meds  Medication Sig  . Ascorbic Acid (VITAMIN C WITH ROSE HIPS) 500 MG tablet Take 500 mg by mouth daily.  . Cholecalciferol (VITAMIN D-3) 125 MCG (5000 UT) TABS Take 1 tablet by mouth daily.  Marland Kitchen estradiol (ESTRACE) 1 MG tablet Take 1 mg by mouth daily.  . metFORMIN (GLUCOPHAGE-XR) 500 MG 24 hr tablet 1 tablet with meals  . NEEDLE, DISP, 18 G (BD HYPODERMIC NEEDLE) 18G X 1" MISC 1 Units by Does not apply route 2 (two) times a week.  . NONFORMULARY OR COMPOUNDED ITEM Inject 5 mg into the muscle 2 (two) times a week. Testosterone cypionate in olive  oil (25 mg/mL).  Dispense 5 mL vial. (Patient taking differently: Inject 6 mg into the muscle 2 (two) times a week. Testosterone cypionate in olive  oil (25 mg/mL).  Dispense 5 mL vial.)  . NP THYROID 120 MG tablet TAKE 1 TABLET BY MOUTH DAILY  . progesterone (PROMETRIUM) 100 MG capsule Take 100 mg by mouth daily.  . valACYclovir (VALTREX) 500 MG tablet Take 500 mg by mouth daily. Take every other day.  . [DISCONTINUED] Cholecalciferol (VITAMIN D) 125 MCG (5000 UT) CAPS 5000 units 1 tablet  . [DISCONTINUED] metFORMIN (GLUCOPHAGE) 500 MG tablet TAKE 1 TABLET(500 MG) BY MOUTH DAILY WITH BREAKFAST  . [DISCONTINUED] progesterone (PROMETRIUM) 100 MG capsule 1 cap      Depression screen [facility] 2/9 02/25/2020 10/21/2019 04/17/2019  Decreased Interest 0 1 0  Down, Depressed, Hopeless 0 0 0  PHQ - 2 Score 0 1 0  Altered sleeping - 0 -  Tired, decreased energy - 1 -  Change in appetite - 0 -  Feeling bad or failure about yourself  - 0 -  Trouble concentrating - 0 -  Moving slowly or fidgety/restless - 0 -  Suicidal thoughts - 0 -  PHQ-9 Score - 2 -  Difficult doing work/chores - Not difficult at all -     Objective:   Today's Vitals: BP 128/80 (BP Location: Right Arm, Patient Position: Sitting, Cuff Size: Normal)   Pulse 80   Temp (!) 97.3 F (36.3 C) (Temporal)   Resp 18   Ht 5\' 6"  (1.676 m)   Wt 153 lb (69.4 kg)   SpO2 98%   BMI 24.69 kg/m   Vitals with BMI 02/25/2020 10/21/2019 10/01/2019  Height 5\' 6"  5\' 6"  5\' 6"   Weight 153 lbs 147 lbs 154 lbs 3 oz  BMI 24.71 23.74 24.9  Systolic 128 109 12/01/2019  Diastolic 80 75 60  Pulse 80 81 90     Physical Exam   She looks systemically well.  She has lost some weight since last time I saw her.  Blood pressure is excellent.    Assessment   1. Primary ovarian failure   2. Hypothyroidism, adult   3. Mixed hyperlipidemia   4. Prediabetes       Tests ordered Orders Placed This Encounter  Procedures  . Estradiol  . Progesterone  . Testos,Total,Free and SHBG (Female)  . T3, free  . TSH  . Lipid panel  . Hemoglobin A1c     Plan: 1. She will continue with all bioidentical hormones and we will check levels today. 2. She will continue with desiccated NP thyroid and I will check thyroid function today. 3. Check lipid panel and A1c to see if there is an improvement since the last time we checked this. 4. I will see her in 3 months time for follow-up and further recommendations will depend on blood results.   No orders of the defined types were placed in this encounter.   , MD

## 2020-02-28 ENCOUNTER — Encounter (INDEPENDENT_AMBULATORY_CARE_PROVIDER_SITE_OTHER): Payer: Self-pay | Admitting: Internal Medicine

## 2020-02-28 MED ORDER — THYROID 120 MG PO TABS: 120.0000 mg | ORAL_TABLET | Freq: Every day | ORAL | 3 refills | Status: DC

## 2020-03-02 LAB — HEMOGLOBIN A1C
Hgb A1c MFr Bld: 5.9 % of total Hgb — ABNORMAL HIGH (ref <5.7)
Mean Plasma Glucose: 123 mg/dL
eAG (mmol/L): 6.8 mmol/L

## 2020-03-02 LAB — LIPID PANEL
Cholesterol: 219 mg/dL — ABNORMAL HIGH (ref <200)
HDL: 48 mg/dL — ABNORMAL LOW (ref >=50)
LDL Cholesterol (Calc): 136 mg/dL (calc) — ABNORMAL HIGH
Non-HDL Cholesterol (Calc): 171 mg/dL (calc) — ABNORMAL HIGH (ref <130)
Total CHOL/HDL Ratio: 4.6 (calc) (ref <5.0)
Triglycerides: 210 mg/dL — ABNORMAL HIGH (ref <150)

## 2020-03-02 LAB — T3, FREE: T3, Free: 4.5 pg/mL — ABNORMAL HIGH (ref 2.3–4.2)

## 2020-03-02 LAB — TESTOS,TOTAL,FREE AND SHBG (FEMALE)
Free Testosterone: 12.2 pg/mL — ABNORMAL HIGH (ref 0.1–6.4)
Sex Hormone Binding: 135 nmol/L — ABNORMAL HIGH (ref 14–73)
Testosterone, Total, LC-MS-MS: 239 ng/dL — ABNORMAL HIGH (ref 2–45)

## 2020-03-02 LAB — PROGESTERONE: Progesterone: 7.9 ng/mL

## 2020-03-02 LAB — TSH: TSH: 0.01 mIU/L — ABNORMAL LOW (ref 0.40–4.50)

## 2020-03-02 LAB — ESTRADIOL: Estradiol: 87 pg/mL

## 2020-03-12 ENCOUNTER — Other Ambulatory Visit (INDEPENDENT_AMBULATORY_CARE_PROVIDER_SITE_OTHER): Payer: Self-pay | Admitting: Nurse Practitioner

## 2020-03-12 DIAGNOSIS — R7303 Prediabetes: Secondary | ICD-10-CM

## 2020-03-29 ENCOUNTER — Encounter (INDEPENDENT_AMBULATORY_CARE_PROVIDER_SITE_OTHER): Payer: Self-pay | Admitting: Internal Medicine

## 2020-03-29 ENCOUNTER — Other Ambulatory Visit (INDEPENDENT_AMBULATORY_CARE_PROVIDER_SITE_OTHER): Payer: Self-pay | Admitting: Internal Medicine

## 2020-03-29 MED ORDER — PROGESTERONE 200 MG PO CAPS: 200.0000 mg | ORAL_CAPSULE | Freq: Every day | ORAL | 1 refills | Status: DC

## 2020-03-29 MED ORDER — ESTRADIOL 1 MG PO TABS: 1.0000 mg | ORAL_TABLET | Freq: Every day | ORAL | 1 refills | Status: DC

## 2020-05-19 ENCOUNTER — Other Ambulatory Visit (INDEPENDENT_AMBULATORY_CARE_PROVIDER_SITE_OTHER): Payer: Self-pay

## 2020-05-19 DIAGNOSIS — R7303 Prediabetes: Secondary | ICD-10-CM

## 2020-05-19 MED ORDER — METFORMIN HCL 500 MG PO TABS: 500.0000 mg | ORAL_TABLET | Freq: Every day | ORAL | 1 refills | Status: DC

## 2020-05-19 MED ORDER — ESTRADIOL 1 MG PO TABS: 1.0000 mg | ORAL_TABLET | Freq: Every day | ORAL | 1 refills | Status: DC

## 2020-05-19 MED ORDER — PROGESTERONE 200 MG PO CAPS: 200.0000 mg | ORAL_CAPSULE | Freq: Every day | ORAL | 1 refills | Status: DC

## 2020-05-19 NOTE — Telephone Encounter (Signed)
Pt asked to have a later appt; because she will be on [place] for 30 days. Then she will start on medicare [employer] [place] in 06/23/20. Pt was reschedule for 06/24/20 @ 10 AM.

## 2020-05-24 ENCOUNTER — Ambulatory Visit (INDEPENDENT_AMBULATORY_CARE_PROVIDER_SITE_OTHER): Payer: BC Managed Care – PPO | Admitting: Internal Medicine

## 2020-06-03 ENCOUNTER — Encounter (INDEPENDENT_AMBULATORY_CARE_PROVIDER_SITE_OTHER): Payer: Self-pay | Admitting: Internal Medicine

## 2020-06-03 ENCOUNTER — Other Ambulatory Visit (INDEPENDENT_AMBULATORY_CARE_PROVIDER_SITE_OTHER): Payer: Self-pay | Admitting: Internal Medicine

## 2020-06-03 MED ORDER — NONFORMULARY OR COMPOUNDED ITEM: 5.0000 mg | 3 refills | Status: DC

## 2020-06-24 ENCOUNTER — Ambulatory Visit (INDEPENDENT_AMBULATORY_CARE_PROVIDER_SITE_OTHER): Payer: Medicare Other | Admitting: Internal Medicine

## 2020-06-24 ENCOUNTER — Encounter (INDEPENDENT_AMBULATORY_CARE_PROVIDER_SITE_OTHER): Payer: Self-pay | Admitting: Internal Medicine

## 2020-06-24 ENCOUNTER — Other Ambulatory Visit: Payer: Self-pay

## 2020-06-24 VITALS — BP 122/82 | HR 72 | Temp 97.2°F | Ht 66.0 in | Wt 151.0 lb

## 2020-06-24 DIAGNOSIS — R7303 Prediabetes: Secondary | ICD-10-CM | POA: Diagnosis not present

## 2020-06-24 DIAGNOSIS — E2839 Other primary ovarian failure: Secondary | ICD-10-CM | POA: Diagnosis not present

## 2020-06-24 DIAGNOSIS — E782 Mixed hyperlipidemia: Secondary | ICD-10-CM

## 2020-06-24 DIAGNOSIS — E039 Hypothyroidism, unspecified: Secondary | ICD-10-CM

## 2020-06-24 NOTE — Progress Notes (Signed)
Metrics: Intervention Frequency ACO  Documented Smoking Status Yearly  Screened one or more times in 24 months  Cessation Counseling or  Active cessation medication Past 24 months  Past 24 months   Guideline developer: UpToDate (See UpToDate for funding source) Date Released: 2014       Wellness Office Visit  Subjective:  Patient ID: Jennifer Herrera, female    DOB: Jul 18, 1955  Age: 65 y.o. MRN: [id number]  CC: This delightful lady comes in for follow-up of hypothyroidism, prediabetes, hyperlipidemia and BHRT for menopausal symptoms. HPI In March of this year she underwent a facelift and she feels that she has had a reasonable result. She continues to take all the medications as prescribed.  She did tolerate the higher dose of progesterone at night. She is prediabetic and does have dyslipidemia.  Past Medical History:  Diagnosis Date  . HLD (hyperlipidemia) 11/20/2018  . Hypothyroidism, adult 11/20/2018  . Prediabetes 11/20/2018  . Thyroid disease   . Vitamin D deficiency disease 11/20/2018   Past Surgical History:  Procedure Laterality Date  . ANAL RECTAL MANOMETRY N/A 08/15/2019   Procedure: ANO RECTAL MANOMETRY;  Surgeon: Charlott Rakes, MD;  Location: [facility];  Service: Endoscopy;  Laterality: N/A;  . AUGMENTATION MAMMAPLASTY Bilateral 1979   saline  . BREAST ENHANCEMENT SURGERY    . FACIAL COSMETIC SURGERY    . SHOULDER ARTHROSCOPY    . TONSILLECTOMY       History reviewed. No pertinent family history.  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   Social History   Tobacco Use  . Smoking status: Never Smoker  . Smokeless tobacco: Never Used  Substance Use Topics  . Alcohol use: Yes    Alcohol/week: 2.0 standard drinks    Types: 2 Glasses of wine per week    Comment: once weekly    Current Meds  Medication Sig  . Cholecalciferol (VITAMIN D-3) 125 MCG  (5000 UT) TABS Take 1 tablet by mouth daily.  Marland Kitchen estradiol (ESTRACE) 1 MG tablet Take 1 tablet (1 mg total) by mouth daily.  . metFORMIN (GLUCOPHAGE) 500 MG tablet Take 1 tablet (500 mg total) by mouth daily with breakfast.  . NEEDLE, DISP, 18 G (BD HYPODERMIC NEEDLE) 18G X 1" MISC 1 Units by Does not apply route 2 (two) times a week.  . NONFORMULARY OR COMPOUNDED ITEM Inject 5 mg into the muscle 2 (two) times a week. Testosterone cypionate in olive  oil (25 mg/mL).  Dispense 5 mL vial.  . progesterone (PROMETRIUM) 200 MG capsule Take 1 capsule (200 mg total) by mouth daily.  Marland Kitchen thyroid (NP THYROID) 120 MG tablet Take 1 tablet (120 mg total) by mouth daily.  . valACYclovir (VALTREX) 500 MG tablet Take 500 mg by mouth daily. Take every other day.     Flowsheet Row Office Visit from 06/24/2020 in [facility]  PHQ-9 Total Score 1      Objective:   Today's Vitals: BP 122/82   Pulse 72   Temp (!) 97.2 F (36.2 C) (Temporal)   Ht 5\' 6"  (1.676 m)   Wt 151 lb (68.5 kg)   SpO2 99%   BMI 24.37 kg/m  Vitals with BMI 06/24/2020 02/25/2020 10/21/2019  Height 5\' 6"  5\' 6"  5\' 6"   Weight 151 lbs 153 lbs 147 lbs  BMI 24.38 24.71 23.74  Systolic 122 128 10/23/2019  Diastolic 82 80  75  Pulse 72 80 81     Physical Exam  She looks systemically well.  She has lost 2 pounds since last visit.  Blood pressure is excellent.     Assessment   1. Hypothyroidism, adult   2. Prediabetes   3. Primary ovarian failure   4. Mixed hyperlipidemia       Tests ordered Orders Placed This Encounter  Procedures  . Hemoglobin A1c  . Lipid panel  . Estradiol  . Progesterone     Plan: 1. Continue with NP thyroid 120 mg daily. 2. Continue with estradiol and progesterone at current doses and we will check levels today. 3. Continue to focus on nutrition.  Check A1c and lipid panel. 4. I will see her in about 3 months time for an annual physical exam   No orders of the defined types were placed in  this encounter.   Wilson Singer, MD

## 2020-06-25 ENCOUNTER — Other Ambulatory Visit (INDEPENDENT_AMBULATORY_CARE_PROVIDER_SITE_OTHER): Payer: Self-pay | Admitting: Internal Medicine

## 2020-06-25 LAB — ESTRADIOL: Estradiol: 45 pg/mL

## 2020-06-25 LAB — LIPID PANEL
Cholesterol: 206 mg/dL — ABNORMAL HIGH (ref <200)
HDL: 49 mg/dL — ABNORMAL LOW (ref >=50)
LDL Cholesterol (Calc): 125 mg/dL (calc) — ABNORMAL HIGH
Non-HDL Cholesterol (Calc): 157 mg/dL (calc) — ABNORMAL HIGH (ref <130)
Total CHOL/HDL Ratio: 4.2 (calc) (ref <5.0)
Triglycerides: 206 mg/dL — ABNORMAL HIGH (ref <150)

## 2020-06-25 LAB — HEMOGLOBIN A1C
Hgb A1c MFr Bld: 5.8 % of total Hgb — ABNORMAL HIGH (ref <5.7)
Mean Plasma Glucose: 120 mg/dL
eAG (mmol/L): 6.6 mmol/L

## 2020-06-25 LAB — PROGESTERONE: Progesterone: 38.1 ng/mL

## 2020-07-05 ENCOUNTER — Encounter (INDEPENDENT_AMBULATORY_CARE_PROVIDER_SITE_OTHER): Payer: Self-pay | Admitting: Internal Medicine

## 2020-07-06 ENCOUNTER — Other Ambulatory Visit (INDEPENDENT_AMBULATORY_CARE_PROVIDER_SITE_OTHER): Payer: Self-pay | Admitting: Internal Medicine

## 2020-07-06 MED ORDER — ESTRADIOL 1 MG PO TABS: 1.5000 mg | ORAL_TABLET | Freq: Every day | ORAL | 1 refills | Status: DC

## 2020-07-07 ENCOUNTER — Other Ambulatory Visit (INDEPENDENT_AMBULATORY_CARE_PROVIDER_SITE_OTHER): Payer: Self-pay | Admitting: Internal Medicine

## 2020-10-06 ENCOUNTER — Ambulatory Visit (INDEPENDENT_AMBULATORY_CARE_PROVIDER_SITE_OTHER): Payer: Medicare Other | Admitting: Internal Medicine

## 2020-12-06 NOTE — Progress Notes (Signed)
History of Present Illness:       This very nice 65 y.o. MWF  presents to establish care in this practice . Patient had been on desiccated thyroid for an alleged  dx/o hypothyroidism  circa 2010 by Dr Jearld Pies (now retired) for also helping with weight loss. And she also prescribed patient  metformin for an established dx/o prediabetes  (A1c's 5.8-6.1%). She has been prescribed estrogen and also testosterone injections for unclear reasons.        Patient is screened for elevated BP & today's BP is at goal - 113/85. Patient has had no complaints of any cardiac type chest pain, palpitations, dyspnea / orthopnea / PND, dizziness, claudication, or dependent edema.       Hyperlipidemia is not controlled with diet. Last Lipids were not at goal :  Lab Results  Component Value Date   CHOL 206 (H) 06/24/2020   HDL 49 (L) 06/24/2020   LDLCALC 125 (H) 06/24/2020   TRIG 206 (H) 06/24/2020   CHOLHDL 4.2 06/24/2020     As above, the patient has history of PreDiabetes (2010)   and she was started on Metformin by Dr Adriana Simas.   She has had no symptoms of reactive hypoglycemia, diabetic polys, paresthesias or visual blurring.  Last A1c was still not at goal:  Lab Results  Component Value Date   HGBA1C 5.8 (H) 06/24/2020                                                          Further, the patient also has history of Vitamin D Deficiency and supplements vitamin D without any suspected side-effects. Last vitamin D was at goal:  Lab Results  Component Value Date   VD25OH 81 04/17/2019     Current Outpatient Medications on File Prior to Visit  Medication Sig   VITAMIN D 5000 u Take 1 table daily.   metFORMIN  500 MG tablet Take 1 tablet  daily with breakfast.   Testosterone cypio in olive  oil (25 mg/mL).   Inject 5 mg into the muscle 2 times a week. Dispense 5 mL vial.   - Off x 2 + weeks    valACYclovir  500 MG tablet Take 500 mg by mouth daily. Take every other day.     (Patient not taking: Reported on 12/07/2020)     Allergies  Allergen Reactions   Sulfa Antibiotics Rash   Sulfasalazine Rash     PMHx:      Immunization History  Administered Date(s) Administered   PFIZER SARS-COV-2 Vacc 01/30/2019, 02/22/2019, 09/30/2019   Tdap 11/20/2018     Past Surgical History:  Procedure Laterality Date   ANAL RECTAL MANOMETRY N/A 08/15/2019   Procedure: ANO RECTAL MANOMETRY;  Surgeon: Wilford Corner, MD;  Location: [facility];  Service: Endoscopy;  Laterality: N/A;   AUGMENTATION MAMMAPLASTY Bilateral 1979   saline   BREAST ENHANCEMENT SURGERY     FACIAL COSMETIC SURGERY     SHOULDER ARTHROSCOPY     TONSILLECTOMY      FHx:    Reviewed / unchanged  SHx:    Reviewed / unchanged   Systems Review:  Constitutional: Denies fever, chills, wt changes, headaches, insomnia, fatigue, night sweats, change in appetite. Eyes: Denies redness, blurred vision, diplopia, discharge, itchy, watery eyes.  ENT: Denies discharge, congestion, post nasal drip, epistaxis, sore throat, earache, hearing loss, dental pain, tinnitus, vertigo, sinus pain, snoring.  CV: Denies chest pain, palpitations, irregular heartbeat, syncope, dyspnea, diaphoresis, orthopnea, PND, claudication or edema. Respiratory: denies cough, dyspnea, DOE, pleurisy, hoarseness, laryngitis, wheezing.  Gastrointestinal: Denies dysphagia, odynophagia, heartburn, reflux, water brash, abdominal pain or cramps, nausea, vomiting, bloating, diarrhea, constipation, hematemesis, melena, hematochezia  or hemorrhoids. Genitourinary: Denies dysuria, frequency, urgency, nocturia, hesitancy, discharge, hematuria or flank pain. Musculoskeletal: Denies arthralgias, myalgias, stiffness, jt. swelling, pain, limping or strain/sprain.  Skin: Denies pruritus, rash, hives, warts, acne, eczema or change in skin lesion(s). Neuro: No weakness, tremor, incoordination, spasms, paresthesia or pain. Psychiatric: Denies  confusion, memory loss or sensory loss. Endo: Denies change in weight, skin or hair change.  Heme/Lymph: No excessive bleeding, bruising or enlarged lymph nodes.  Physical Exam  BP 113/85   Pulse 69   Temp 97.9 F (36.6 C)   Resp 16   Ht 5\' 6"  (1.676 m)   Wt 152 lb 3.2 oz (69 kg)   SpO2 99%   BMI 24.57 kg/m   Appears  well nourished, well groomed  and in no distress.  Eyes: PERRLA, EOMs, conjunctiva no swelling or erythema. Sinuses: No frontal/maxillary tenderness ENT/Mouth: EAC's clear, TM's nl w/o erythema, bulging. Nares clear w/o erythema, swelling, exudates. Oropharynx clear without erythema or exudates. Oral hygiene is good. Tongue normal, non obstructing. Hearing intact.  Neck: Supple. Thyroid not palpable. Car 2+/2+ without bruits, nodes or JVD. Chest: Respirations nl with BS clear & equal w/o rales, rhonchi, wheezing or stridor.  Cor: Heart sounds normal w/ regular rate and rhythm without sig. murmurs, gallops, clicks or rubs. Peripheral pulses normal and equal  without edema.  Abdomen: Soft & bowel sounds normal. Non-tender w/o guarding, rebound, hernias, masses or organomegaly.  Lymphatics: Unremarkable.  Musculoskeletal: Full ROM all peripheral extremities, joint stability, 5/5 strength and normal gait.  Skin: Warm, dry without exposed rashes, lesions or ecchymosis apparent.  Neuro: Cranial nerves intact, reflexes equal bilaterally. Sensory-motor testing grossly intact. Tendon reflexes grossly intact.  Pysch: Alert & oriented x 3.  Insight and judgement nl & appropriate. No ideations.  Assessment and Plan:  1. Hypertension screen  -  monitor blood pressure at home.  - Continue diet.  Reminder to go to the ER if any CP,  SOB, nausea, dizziness, severe HA, changes vision/speech.   - CBC with Differential/Platelet - COMPLETE METABOLIC PANEL WITH GFR - Magnesium  2. Hyperlipidemia, mixed  - Continue diet, exercise,& lifestyle modifications.  - Continue monitor  periodic cholesterol/liver & renal functions    - Lipid panel - TSH  3. Abnormal glucose  - Continue diet, exercise  - Lifestyle modifications.  - Monitor appropriate labs   - Hemoglobin A1c - Insulin, random  4. Vitamin D deficiency  - Continue supplementation  - VITAMIN D 25 Hydroxy   5. Hypothyroidism, by hx  - TSH  6. Prediabetes  - Hemoglobin A1c - Insulin, random  7. Fatigue, unspecified type  - CBC with Differential/Platelet - TSH  8. Need for immunization against influenza  - Flu vaccine HIGH DOSE PF (Fluzone High dose)  9. Medication management  - CBC with Differential/Platelet - COMPLETE METABOLIC PANEL WITH GFR - Magnesium - Lipid panel - TSH - Hemoglobin A1c - Insulin, random - VITAMIN D 25 Hydroxy          Discussed  regular exercise, BP monitoring, weight control to achieve/maintain BMI less than 25 and discussed  med and SE's. Recommended labs to assess and monitor clinical status with further disposition pending results of labs.  I discussed the assessment and treatment plan with the patient. The patient was provided an opportunity to ask questions and all were answered. The patient agreed with the plan and demonstrated an understanding of the instructions.  I provided over 50 minutes of exam, counseling, chart review and  complex critical decision making.        The patient was advised to call back or seek an in-person evaluation if the symptoms worsen or if the condition fails to improve as anticipated.     Marinus Maw, MD

## 2020-12-07 ENCOUNTER — Encounter: Payer: Self-pay | Admitting: Internal Medicine

## 2020-12-07 ENCOUNTER — Ambulatory Visit (INDEPENDENT_AMBULATORY_CARE_PROVIDER_SITE_OTHER): Payer: Medicare Other | Admitting: Internal Medicine

## 2020-12-07 ENCOUNTER — Other Ambulatory Visit: Payer: Self-pay

## 2020-12-07 VITALS — BP 113/85 | HR 69 | Temp 97.9°F | Resp 16 | Ht 66.0 in | Wt 152.2 lb

## 2020-12-07 DIAGNOSIS — R7303 Prediabetes: Secondary | ICD-10-CM

## 2020-12-07 DIAGNOSIS — E559 Vitamin D deficiency, unspecified: Secondary | ICD-10-CM | POA: Diagnosis not present

## 2020-12-07 DIAGNOSIS — Z136 Encounter for screening for cardiovascular disorders: Secondary | ICD-10-CM

## 2020-12-07 DIAGNOSIS — E039 Hypothyroidism, unspecified: Secondary | ICD-10-CM | POA: Diagnosis not present

## 2020-12-07 DIAGNOSIS — Z79899 Other long term (current) drug therapy: Secondary | ICD-10-CM

## 2020-12-07 DIAGNOSIS — R5383 Other fatigue: Secondary | ICD-10-CM

## 2020-12-07 DIAGNOSIS — E782 Mixed hyperlipidemia: Secondary | ICD-10-CM

## 2020-12-07 DIAGNOSIS — R7309 Other abnormal glucose: Secondary | ICD-10-CM | POA: Diagnosis not present

## 2020-12-07 DIAGNOSIS — Z23 Encounter for immunization: Secondary | ICD-10-CM | POA: Diagnosis not present

## 2020-12-07 MED ORDER — TOPIRAMATE 50 MG PO TABS: ORAL_TABLET | ORAL | 1 refills | Status: DC

## 2020-12-07 MED ORDER — PHENTERMINE HCL 37.5 MG PO TABS: ORAL_TABLET | ORAL | 1 refills | Status: DC

## 2020-12-07 NOTE — Patient Instructions (Signed)

## 2020-12-08 ENCOUNTER — Other Ambulatory Visit: Payer: Self-pay | Admitting: Internal Medicine

## 2020-12-08 LAB — COMPLETE METABOLIC PANEL WITH GFR
AG Ratio: 1.6 (calc) (ref 1.0–2.5)
ALT: 16 U/L (ref 6–29)
AST: 15 U/L (ref 10–35)
Albumin: 4.2 g/dL (ref 3.6–5.1)
Alkaline phosphatase (APISO): 73 U/L (ref 37–153)
BUN: 12 mg/dL (ref 7–25)
CO2: 27 mmol/L (ref 20–32)
Calcium: 9.7 mg/dL (ref 8.6–10.4)
Chloride: 102 mmol/L (ref 98–110)
Creat: 0.92 mg/dL (ref 0.50–1.05)
Globulin: 2.6 g/dL (calc) (ref 1.9–3.7)
Glucose, Bld: 98 mg/dL (ref 65–99)
Potassium: 4.4 mmol/L (ref 3.5–5.3)
Sodium: 138 mmol/L (ref 135–146)
Total Bilirubin: 0.9 mg/dL (ref 0.2–1.2)
Total Protein: 6.8 g/dL (ref 6.1–8.1)
eGFR: 69 mL/min/{1.73_m2} (ref >=60)

## 2020-12-08 LAB — LIPID PANEL
Cholesterol: 262 mg/dL — ABNORMAL HIGH (ref <200)
HDL: 61 mg/dL (ref >=50)
LDL Cholesterol (Calc): 175 mg/dL (calc) — ABNORMAL HIGH
Non-HDL Cholesterol (Calc): 201 mg/dL (calc) — ABNORMAL HIGH (ref <130)
Total CHOL/HDL Ratio: 4.3 (calc) (ref <5.0)
Triglycerides: 129 mg/dL (ref <150)

## 2020-12-08 LAB — CBC WITH DIFFERENTIAL/PLATELET
Absolute Monocytes: 397 cells/uL (ref 200–950)
Basophils Absolute: 20 cells/uL (ref 0–200)
Basophils Relative: 0.3 %
Eosinophils Absolute: 72 cells/uL (ref 15–500)
Eosinophils Relative: 1.1 %
HCT: 45.5 % — ABNORMAL HIGH (ref 35.0–45.0)
Hemoglobin: 15.5 g/dL (ref 11.7–15.5)
Lymphs Abs: 2639 cells/uL (ref 850–3900)
MCH: 32 pg (ref 27.0–33.0)
MCHC: 34.1 g/dL (ref 32.0–36.0)
MCV: 94 fL (ref 80.0–100.0)
MPV: 11.3 fL (ref 7.5–12.5)
Monocytes Relative: 6.1 %
Neutro Abs: 3374 cells/uL (ref 1500–7800)
Neutrophils Relative %: 51.9 %
Platelets: 310 10*3/uL (ref 140–400)
RBC: 4.84 10*6/uL (ref 3.80–5.10)
RDW: 12.2 % (ref 11.0–15.0)
Total Lymphocyte: 40.6 %
WBC: 6.5 10*3/uL (ref 3.8–10.8)

## 2020-12-08 LAB — INSULIN, RANDOM: Insulin: 4.9 u[IU]/mL

## 2020-12-08 LAB — TSH: TSH: 1.85 mIU/L (ref 0.40–4.50)

## 2020-12-08 LAB — HEMOGLOBIN A1C
Hgb A1c MFr Bld: 5.9 % of total Hgb — ABNORMAL HIGH (ref <5.7)
Mean Plasma Glucose: 123 mg/dL
eAG (mmol/L): 6.8 mmol/L

## 2020-12-08 LAB — VITAMIN D 25 HYDROXY (VIT D DEFICIENCY, FRACTURES): Vit D, 25-Hydroxy: 93 ng/mL (ref 30–100)

## 2020-12-08 LAB — MAGNESIUM: Magnesium: 2.1 mg/dL (ref 1.5–2.5)

## 2020-12-08 MED ORDER — ROSUVASTATIN CALCIUM 10 MG PO TABS: ORAL_TABLET | ORAL | 3 refills | Status: DC

## 2020-12-08 NOTE — Progress Notes (Signed)
============================================================ - Test results slightly outside the reference range are not unusual. If there is anything important, I will review this with you,  otherwise it is considered normal test values.  If you have further questions,  please do not hesitate to contact me at the office or via My Chart.  ============================================================ ============================================================  -  CBC shows WBC, Hgb / Red Cell Counts & Platelet counts - All Normal  ============================================================ ============================================================  - Lipid profile shows  -  Total  Chol =      262       - Elevated             (  Ideal  or  Goal is less than 180  !  )  - and  -  Bad / Dangerous LDL  Chol =     175      - also Elevated              (  Ideal  or  Goal is less than 70  !  )   - Because Cholesterol is  high risk,  sent in Rx for low dose Rosuvastatin / Crestor to your [facility]                                                                         until you can get on a better diet   - Cholesterol only comes from animal sources  - ie. meat, dairy, egg yolks  - Eat all the vegetables you want.  - Avoid meat, especially red meat - Beef AND Pork .  - Avoid cheese & dairy - milk & ice cream.     - Cheese is the most concentrated form of trans-fats which  is the worst thing to clog up our arteries.   - Veggie cheese is OK which can be found in the fresh  produce section at [employer] or [employer] or [employer] ============================================================ ============================================================  - Will recheck your Chol when you return in 3 months  ============================================================ ============================================================  - TSH is normal Now, whereas  previous  levels 9 & 12 months ago showed                                very high levels  or what would be called "toxic" levels which   accelerates bone loss (Osteoporosis) and                                                              predisposes to heart disease/ arrhythmia   Protein loss / or muscle breakdown   - So for now , is best not to take a thyroid supplement  ============================================================ ============================================================  - A1c = 5.9% - So as discuss, stop your current 1 tablet of Metformin &  we'll see how your sugars are at your 3 month return visit.  ============================================================ ============================================================  - Vitamin D = 93 - Excellent -           Please keep dose same. ============================================================ ============================================================  -  Hopefully when you get started on the Phentermine,                                                         it will provide with a sense of "more energy"  ============================================================ ============================================================  -

## 2020-12-12 ENCOUNTER — Other Ambulatory Visit: Payer: Self-pay | Admitting: Internal Medicine

## 2020-12-12 ENCOUNTER — Encounter: Payer: Self-pay | Admitting: Internal Medicine

## 2020-12-12 MED ORDER — TRULICITY 0.75 MG/0.5ML ~~LOC~~ SOAJ: SUBCUTANEOUS | 0 refills | Status: DC

## 2020-12-13 ENCOUNTER — Encounter: Payer: Self-pay | Admitting: Internal Medicine

## 2020-12-17 ENCOUNTER — Encounter: Payer: Self-pay | Admitting: Internal Medicine

## 2020-12-23 DIAGNOSIS — K581 Irritable bowel syndrome with constipation: Secondary | ICD-10-CM | POA: Diagnosis not present

## 2020-12-23 DIAGNOSIS — K5904 Chronic idiopathic constipation: Secondary | ICD-10-CM | POA: Diagnosis not present

## 2020-12-24 ENCOUNTER — Other Ambulatory Visit: Payer: Self-pay | Admitting: Physician Assistant

## 2020-12-24 ENCOUNTER — Ambulatory Visit
Admission: RE | Admit: 2020-12-24 | Discharge: 2020-12-24 | Disposition: A | Discharge status: Home / Self Care | Payer: BC Managed Care – PPO | Source: Ambulatory Visit | Attending: Physician Assistant | Admitting: Physician Assistant

## 2020-12-24 DIAGNOSIS — K5900 Constipation, unspecified: Secondary | ICD-10-CM | POA: Diagnosis not present

## 2020-12-24 DIAGNOSIS — K5904 Chronic idiopathic constipation: Secondary | ICD-10-CM

## 2020-12-24 DIAGNOSIS — Z8719 Personal history of other diseases of the digestive system: Secondary | ICD-10-CM | POA: Diagnosis not present

## 2021-01-07 ENCOUNTER — Other Ambulatory Visit: Payer: Self-pay | Admitting: Internal Medicine

## 2021-01-08 ENCOUNTER — Other Ambulatory Visit: Payer: Self-pay | Admitting: Internal Medicine

## 2021-01-08 MED ORDER — TRULICITY 1.5 MG/0.5ML ~~LOC~~ SOAJ: 1.5000 mg | SUBCUTANEOUS | 0 refills | Status: DC

## 2021-01-11 DIAGNOSIS — Z6823 Body mass index (BMI) 23.0-23.9, adult: Secondary | ICD-10-CM | POA: Diagnosis not present

## 2021-01-11 DIAGNOSIS — Z01419 Encounter for gynecological examination (general) (routine) without abnormal findings: Secondary | ICD-10-CM | POA: Diagnosis not present

## 2021-01-27 DIAGNOSIS — K581 Irritable bowel syndrome with constipation: Secondary | ICD-10-CM | POA: Diagnosis not present

## 2021-01-27 DIAGNOSIS — Z1211 Encounter for screening for malignant neoplasm of colon: Secondary | ICD-10-CM | POA: Diagnosis not present

## 2021-01-27 DIAGNOSIS — K5901 Slow transit constipation: Secondary | ICD-10-CM | POA: Diagnosis not present

## 2021-02-01 DIAGNOSIS — K5900 Constipation, unspecified: Secondary | ICD-10-CM | POA: Diagnosis not present

## 2021-02-01 DIAGNOSIS — R6889 Other general symptoms and signs: Secondary | ICD-10-CM | POA: Diagnosis not present

## 2021-02-02 ENCOUNTER — Other Ambulatory Visit: Payer: Self-pay | Admitting: Internal Medicine

## 2021-02-02 DIAGNOSIS — E039 Hypothyroidism, unspecified: Secondary | ICD-10-CM

## 2021-02-02 MED ORDER — LEVOTHYROXINE SODIUM 100 MCG PO TABS: ORAL_TABLET | ORAL | 1 refills | Status: DC

## 2021-02-04 ENCOUNTER — Other Ambulatory Visit: Payer: Self-pay | Admitting: Internal Medicine

## 2021-02-22 DIAGNOSIS — Z1211 Encounter for screening for malignant neoplasm of colon: Secondary | ICD-10-CM | POA: Diagnosis not present

## 2021-02-22 NOTE — Progress Notes (Signed)
° ° °  Future Appointments  Date Time Provider Department  02/23/2021 10:30 AM Lucky Cowboy, MD [facility]    History of Present Illness:      Patient is a very nice 66 yo MWF  with hx/o elevated BP and HLD who had been treated with Metformin, desiccated thyroid extract, estrogen & testosterone injections in the past . Recent labs 3 months ago  off above supplements did show her TSH was Normal and A1c was 5.9% and Lipid profile  showed high Lipids with elevated T Chol 262  & LDL 175 and she was started on Rosuvastatin 10 mg low dose .  Patient has latent DM and was on Metformin & Trulicity with 14 # weight loss over the last 2 months.   Wt Readings from Last 3 Encounters:  02/23/21 138 lb 9.6 oz (62.9 kg)  12/07/20 152 lb 3.2 oz (69 kg)    Medications    levothyroxine  100 MCG tablet, Take  1 tablet  Daily  on an empty stomach with only water for 30 minutes & no Antacid meds, Calcium or Magnesium for 4 hours & avoid Biotin    TRULICITY 1.5 MG/0.5ML SOPN, 1.5 MG UNDER THE SKIN 1 TIME A WEEK    rosuvastatin 10 MG tablet, Take 1 tablet Daily for Cholesterol    VITAMIN D 5000 u, Take 1 tablet  daily.   phentermine 37.5 MG tablet, Take 1/2 to 1 tablet every Morning for Dieting & Weight Loss   topiramate 50 MG tablet, Take 1/2 to 1 tablet 2 x /day at Suppertime & Bedtime for Dieting & Weight Loss   valACYclovir 500 MG tablet, Take 500 mg by mouth daily. Take every other day.  Problem list She has Chronic constipation; Redundant colon; Hypothyroidism, adult; Vitamin D deficiency disease; Prediabetes; and HLD (hyperlipidemia) on their problem list.   Observations/Objective:  BP 114/68    Pulse 82    Temp 97.9 F (36.6 C)    Resp 16    Ht 5\' 6"  (1.676 m)    Wt 138 lb 9.6 oz (62.9 kg)    SpO2 98%    BMI 22.37 kg/m   HEENT - WNL. Neck - supple.  Chest - Clear equal BS. Cor - Nl HS. RRR w/o sig MGR. PP 1(+). No edema. MS- FROM w/o deformities.  Gait Nl. Neuro -  Nl w/o focal  abnormalities.  Assessment and Plan:  1. Elevated BP without diagnosis of hypertension  - CBC with Differential/Platelet - COMPLETE METABOLIC PANEL WITH GFR  2. Hyperlipidemia, mixed  - Lipid panel  3. Diet-controlled type 2 diabetes mellitus (HCC)  - Hemoglobin A1c - Insulin, random - COMPLETE METABOLIC PANEL WITH GFR  4. Vitamin D deficiency  - VITAMIN D 25 Hydroxy   5. Hypothyroidism - TSH  6. Medication management  - Hemoglobin A1c - Insulin, random - VITAMIN D 25 Hydroxy  - TSH - CBC with Differential/Platelet - COMPLETE METABOLIC PANEL WITH GFR - Lipid panel  Follow Up Instructions:      I discussed the assessment and treatment plan with the patient. The patient was provided an opportunity to ask questions and all were answered. The patient agreed with the plan and demonstrated an understanding of the instructions.       The patient was advised to call back or seek an in-person evaluation if the symptoms worsen or if the condition fails to improve as anticipated.   , MD

## 2021-02-23 ENCOUNTER — Ambulatory Visit (INDEPENDENT_AMBULATORY_CARE_PROVIDER_SITE_OTHER): Payer: Medicare Other | Admitting: Internal Medicine

## 2021-02-23 ENCOUNTER — Encounter: Payer: Self-pay | Admitting: Internal Medicine

## 2021-02-23 ENCOUNTER — Other Ambulatory Visit: Payer: Self-pay

## 2021-02-23 VITALS — BP 114/68 | HR 82 | Temp 97.9°F | Resp 16 | Ht 66.0 in | Wt 138.6 lb

## 2021-02-23 DIAGNOSIS — E782 Mixed hyperlipidemia: Secondary | ICD-10-CM

## 2021-02-23 DIAGNOSIS — R030 Elevated blood-pressure reading, without diagnosis of hypertension: Secondary | ICD-10-CM | POA: Diagnosis not present

## 2021-02-23 DIAGNOSIS — E559 Vitamin D deficiency, unspecified: Secondary | ICD-10-CM

## 2021-02-23 DIAGNOSIS — E039 Hypothyroidism, unspecified: Secondary | ICD-10-CM | POA: Diagnosis not present

## 2021-02-23 DIAGNOSIS — Z79899 Other long term (current) drug therapy: Secondary | ICD-10-CM | POA: Diagnosis not present

## 2021-02-23 DIAGNOSIS — E119 Type 2 diabetes mellitus without complications: Secondary | ICD-10-CM | POA: Diagnosis not present

## 2021-02-23 NOTE — Patient Instructions (Signed)

## 2021-02-24 ENCOUNTER — Other Ambulatory Visit: Payer: Self-pay | Admitting: Internal Medicine

## 2021-02-24 DIAGNOSIS — E039 Hypothyroidism, unspecified: Secondary | ICD-10-CM

## 2021-02-24 LAB — COMPLETE METABOLIC PANEL WITH GFR
AG Ratio: 1.7 (calc) (ref 1.0–2.5)
ALT: 130 U/L — ABNORMAL HIGH (ref 6–29)
AST: 72 U/L — ABNORMAL HIGH (ref 10–35)
Albumin: 4.3 g/dL (ref 3.6–5.1)
Alkaline phosphatase (APISO): 85 U/L (ref 37–153)
BUN: 11 mg/dL (ref 7–25)
CO2: 27 mmol/L (ref 20–32)
Calcium: 9.9 mg/dL (ref 8.6–10.4)
Chloride: 103 mmol/L (ref 98–110)
Creat: 0.86 mg/dL (ref 0.50–1.05)
Globulin: 2.6 g/dL (calc) (ref 1.9–3.7)
Glucose, Bld: 88 mg/dL (ref 65–99)
Potassium: 4 mmol/L (ref 3.5–5.3)
Sodium: 139 mmol/L (ref 135–146)
Total Bilirubin: 1.9 mg/dL — ABNORMAL HIGH (ref 0.2–1.2)
Total Protein: 6.9 g/dL (ref 6.1–8.1)
eGFR: 74 mL/min/{1.73_m2} (ref >=60)

## 2021-02-24 LAB — CBC WITH DIFFERENTIAL/PLATELET
Absolute Monocytes: 528 cells/uL (ref 200–950)
Basophils Absolute: 30 cells/uL (ref 0–200)
Basophils Relative: 0.5 %
Eosinophils Absolute: 42 cells/uL (ref 15–500)
Eosinophils Relative: 0.7 %
HCT: 42.2 % (ref 35.0–45.0)
Hemoglobin: 14.5 g/dL (ref 11.7–15.5)
Lymphs Abs: 1914 cells/uL (ref 850–3900)
MCH: 32.8 pg (ref 27.0–33.0)
MCHC: 34.4 g/dL (ref 32.0–36.0)
MCV: 95.5 fL (ref 80.0–100.0)
MPV: 11.3 fL (ref 7.5–12.5)
Monocytes Relative: 8.8 %
Neutro Abs: 3486 cells/uL (ref 1500–7800)
Neutrophils Relative %: 58.1 %
Platelets: 275 10*3/uL (ref 140–400)
RBC: 4.42 10*6/uL (ref 3.80–5.10)
RDW: 13.4 % (ref 11.0–15.0)
Total Lymphocyte: 31.9 %
WBC: 6 10*3/uL (ref 3.8–10.8)

## 2021-02-24 LAB — TSH: TSH: 0.08 mIU/L — ABNORMAL LOW (ref 0.40–4.50)

## 2021-02-24 LAB — LIPID PANEL
Cholesterol: 88 mg/dL (ref <200)
HDL: 37 mg/dL — ABNORMAL LOW (ref >=50)
LDL Cholesterol (Calc): 37 mg/dL (calc)
Non-HDL Cholesterol (Calc): 51 mg/dL (calc) (ref <130)
Total CHOL/HDL Ratio: 2.4 (calc) (ref <5.0)
Triglycerides: 59 mg/dL (ref <150)

## 2021-02-24 LAB — VITAMIN D 25 HYDROXY (VIT D DEFICIENCY, FRACTURES): Vit D, 25-Hydroxy: 101 ng/mL — ABNORMAL HIGH (ref 30–100)

## 2021-02-24 LAB — INSULIN, RANDOM: Insulin: 12.8 u[IU]/mL

## 2021-02-24 LAB — HEMOGLOBIN A1C
Hgb A1c MFr Bld: 5.8 % of total Hgb — ABNORMAL HIGH (ref <5.7)
Mean Plasma Glucose: 120 mg/dL
eAG (mmol/L): 6.6 mmol/L

## 2021-03-01 ENCOUNTER — Other Ambulatory Visit: Payer: Self-pay | Admitting: Internal Medicine

## 2021-03-01 ENCOUNTER — Encounter: Payer: Self-pay | Admitting: Internal Medicine

## 2021-03-01 DIAGNOSIS — E039 Hypothyroidism, unspecified: Secondary | ICD-10-CM

## 2021-03-01 DIAGNOSIS — E119 Type 2 diabetes mellitus without complications: Secondary | ICD-10-CM

## 2021-03-01 MED ORDER — TRULICITY 3 MG/0.5ML ~~LOC~~ SOAJ: 3.0000 mg | SUBCUTANEOUS | 0 refills | Status: DC

## 2021-03-01 MED ORDER — LEVOTHYROXINE SODIUM 88 MCG PO TABS: ORAL_TABLET | ORAL | 1 refills | Status: DC

## 2021-03-02 ENCOUNTER — Encounter: Payer: Self-pay | Admitting: Internal Medicine

## 2021-03-06 ENCOUNTER — Other Ambulatory Visit: Payer: Self-pay | Admitting: Nurse Practitioner

## 2021-03-09 ENCOUNTER — Ambulatory Visit: Payer: BC Managed Care – PPO | Admitting: Internal Medicine

## 2021-04-07 ENCOUNTER — Other Ambulatory Visit: Payer: Self-pay | Admitting: Internal Medicine

## 2021-05-06 ENCOUNTER — Other Ambulatory Visit: Payer: Self-pay | Admitting: Internal Medicine

## 2021-05-06 ENCOUNTER — Encounter: Payer: Self-pay | Admitting: Internal Medicine

## 2021-05-06 DIAGNOSIS — E119 Type 2 diabetes mellitus without complications: Secondary | ICD-10-CM

## 2021-05-06 MED ORDER — TRULICITY 3 MG/0.5ML ~~LOC~~ SOAJ: SUBCUTANEOUS | 3 refills | Status: DC

## 2021-05-06 MED ORDER — ONDANSETRON HCL 8 MG PO TABS: ORAL_TABLET | ORAL | 1 refills | Status: DC

## 2021-05-31 ENCOUNTER — Other Ambulatory Visit: Payer: Self-pay | Admitting: Internal Medicine

## 2021-06-02 ENCOUNTER — Ambulatory Visit (INDEPENDENT_AMBULATORY_CARE_PROVIDER_SITE_OTHER): Payer: BC Managed Care – PPO | Admitting: Adult Health

## 2021-06-02 ENCOUNTER — Encounter: Payer: Self-pay | Admitting: Adult Health

## 2021-06-02 VITALS — BP 102/62 | HR 86 | Temp 97.5°F | Wt 126.0 lb

## 2021-06-02 DIAGNOSIS — E785 Hyperlipidemia, unspecified: Secondary | ICD-10-CM

## 2021-06-02 DIAGNOSIS — E039 Hypothyroidism, unspecified: Secondary | ICD-10-CM | POA: Diagnosis not present

## 2021-06-02 DIAGNOSIS — K5909 Other constipation: Secondary | ICD-10-CM | POA: Diagnosis not present

## 2021-06-02 DIAGNOSIS — Z0001 Encounter for general adult medical examination with abnormal findings: Secondary | ICD-10-CM | POA: Diagnosis not present

## 2021-06-02 DIAGNOSIS — R6889 Other general symptoms and signs: Secondary | ICD-10-CM | POA: Diagnosis not present

## 2021-06-02 DIAGNOSIS — E1122 Type 2 diabetes mellitus with diabetic chronic kidney disease: Secondary | ICD-10-CM | POA: Diagnosis not present

## 2021-06-02 DIAGNOSIS — E559 Vitamin D deficiency, unspecified: Secondary | ICD-10-CM

## 2021-06-02 DIAGNOSIS — E1169 Type 2 diabetes mellitus with other specified complication: Secondary | ICD-10-CM

## 2021-06-02 DIAGNOSIS — E119 Type 2 diabetes mellitus without complications: Secondary | ICD-10-CM

## 2021-06-02 DIAGNOSIS — N182 Chronic kidney disease, stage 2 (mild): Secondary | ICD-10-CM

## 2021-06-02 DIAGNOSIS — Q438 Other specified congenital malformations of intestine: Secondary | ICD-10-CM

## 2021-06-02 DIAGNOSIS — H43812 Vitreous degeneration, left eye: Secondary | ICD-10-CM

## 2021-06-02 DIAGNOSIS — Z0000 Encounter for general adult medical examination without abnormal findings: Secondary | ICD-10-CM

## 2021-06-02 DIAGNOSIS — Z79899 Other long term (current) drug therapy: Secondary | ICD-10-CM | POA: Diagnosis not present

## 2021-06-02 DIAGNOSIS — Z23 Encounter for immunization: Secondary | ICD-10-CM | POA: Diagnosis not present

## 2021-06-02 MED ORDER — LEVOTHYROXINE SODIUM 88 MCG PO TABS: ORAL_TABLET | ORAL | 3 refills | Status: DC

## 2021-06-02 NOTE — Progress Notes (Signed)
?MEDICARE ANNUAL WELLNESS VISIT AND FOLLOW UP ? ?Assessment:  ? ?[name] was seen today for medicare wellness and follow-up. ? ?Diagnoses and all orders for this visit: ? ?Encounter for Medicare annual wellness exam ?Due annually  ?Health maintenance reviewed ?- check with insurance about shingrix, can get at pharmacy ?- diabetes eye exam/report requested ?- GYN reports requested from Dr. Sherran Needs office ?- plan urine tests at [facility] ? ?Diet-controlled type 2 diabetes mellitus ([place]) ?Education: Reviewed ?ABCs? of diabetes management (respective goals in parentheses):  A1C (<7), blood pressure (<130/80), and cholesterol (LDL <70) ?Eye Exam yearly and Dental Exam every 6 months. ?Dietary recommendations ?Physical Activity recommendations ?-     Schedule diabetes eye exam, report requested ?-     COMPLETE METABOLIC PANEL WITH GFR ?-     HM DIABETES FOOT EXAM - normal  ? ?Hyperlipidemia associated with type 2 diabetes mellitus ([place]) ?Continue medications: rosuvastatin - currently 10 mg/day, consider reduction to 5 mg/day pending results ?LDL goal <70 ?Continue low cholesterol diet and exercise.  ?Check lipid panel.  ?-     Lipid panel ?-     TSH ? ?CKD stage 2 due to type 2 diabetes mellitus ([place]) ?Normal for age;  ?Increase fluids, avoid NSAIDS, monitor sugars, will monitor ?      -     CMP/GFR ? ?Hypothyroidism, unspecified type ?Per patient symptoms of hypothyroid unless maintained mildly in hyperthyroid range  ?continue medications the same pending lab results ?reminded to take on an empty stomach 30-28mns before food.  ?check TSH level ?-     levothyroxine (SYNTHROID) 88 MCG tablet; Take  1 tablet  Daily  on an empty stomach with only water for 30 minutes & no Antacid meds, Calcium or Magnesium for 4 hours & avoid Biotin ?-     TSH ? ?Chronic constipation ?[facility] follows, continue fiber, fluids, linzess ? ?Redundant colon ?[facility] follows ? ?Vitamin D deficiency disease ?Continue supplement  ? ?Medication  management ?-     CBC with Differential/Platelet ?-     COMPLETE METABOLIC PANEL WITH GFR ? ?Posterior vitreous detachment of left eye ?Stable since 2016; Dr. PSharen Counterfollows  ? ?Need for pneumococcal vaccination ?-     Pneumococcal conjugate vaccine 20-valent (Prevnar 20) ? ?Over 40 minutes of exam, counseling, chart review and critical decision making was performed ?Future Appointments  ?Date Time Provider [place] ?09/06/2021  4:00 PM MUnk Pinto MD [facility] None  ?12/08/2021  3:00 PM MUnk Pinto MD [facility] None  ?06/05/2022  4:00 PM CDarrol Jump NP [facility] None  ? ?Plan:  ? ?During the course of the visit the patient was educated and counseled about appropriate screening and preventive services including:  ? ?Pneumococcal vaccine  ?Prevnar 13 ?Influenza vaccine ?Td vaccine ?Screening electrocardiogram ?Bone densitometry screening ?Colorectal cancer screening ?Diabetes screening ?Glaucoma screening ?Nutrition counseling  ?Advanced directives: requested ? ? ?Subjective:  ?Jennifer Herrera a 66y.o. female who presents for Medicare Annual Wellness Visit and 3 month follow up. She has Chronic constipation; Redundant colon; Hypothyroidism; Vitamin D deficiency disease; Hyperlipidemia associated with type 2 diabetes mellitus ([place]; Diet-controlled type 2 diabetes mellitus ([place]; Posterior vitreous detachment of left eye; and CKD stage 2 due to type 2 diabetes mellitus ([place] on their problem list. ? ?She is new to our office in 2022, formerly followed by Dr. EAdriana Herrera(retired). She is still working full time at [employer]. Married, 2 children, 4 grandchildren.  ? ?She has  bil hearing aids since 2012, work reasonably well but still struggles some with low tones. Plans to follow up this year.  ? ?She has left posterior vitreous detachment in 2016, no retinal detachment, follows with Dr. Sharen Counter.  ? ?R shoulder pain intermittent, hx of impingement decompression by Dr.  Onnie Graham, rarely will use some ice if needed.  ? ?She has redundant colon, chronic constipation, managing with linzess 145 mg. Follows at [place]. Had negative colonoscopy in 2016.  ? ?BMI is Body mass index is 20.34 kg/m?., she has been working on diet and exercise (dancing 3 days a week). Lost a lot of weight over 5456 with trulicity and has tapered off. Trying to continue with good lifstyle choices, pushing veggie intake.  ?Wt Readings from Last 3 Encounters:  ?06/02/21 126 lb (57.2 kg)  ?02/23/21 138 lb 9.6 oz (62.9 kg)  ?12/07/20 152 lb 3.2 oz (69 kg)  ? ?No hx of htn, today their BP is BP: 102/62 ?She does workout. She denies chest pain, shortness of breath, dizziness.  ? ?She is on cholesterol medication (rosuvastatin 10 mg daily) and denies myalgias. Her cholesterol is at goal. The cholesterol last visit was:   ?Lab Results  ?Component Value Date  ? CHOL 88 02/23/2021  ? HDL 37 (L) 02/23/2021  ? [place] 37 02/23/2021  ? TRIG 59 02/23/2021  ? CHOLHDL 2.4 02/23/2021  ? ?She has dx of T2DM (A1C up to 6.7% at previous office per patient), was on metformin/trulicy but tapered off after weight loss and now lifestyle controlled, and denies nausea, paresthesia of the feet, polydipsia, and polyuria. Last A1C in the office was:  ?Lab Results  ?Component Value Date  ? HGBA1C 5.8 (H) 02/23/2021  ? ?Renals in CKD II range; Last GFR: ?Lab Results  ?Component Value Date  ? EGFR 74 02/23/2021  ? ?She is taking levothyroxine for hypothyroid. She reports historically with severe cold intolerance/worsening constipation unless maintained in mild hyperthyroid range. States has been maintained on 75 mcg-88 mcg for many years.  ?Lab Results  ?Component Value Date  ? TSH 0.08 (L) 02/23/2021  ? ?Patient is on Vitamin D supplement, 5000 IU for many years.   ?Lab Results  ?Component Value Date  ? VD25OH 101 (H) 02/23/2021  ?   ? ?Medication Review: ?Current Outpatient Medications on File Prior to Visit  ?Medication Sig Dispense Refill   ? Cholecalciferol (VITAMIN D-3) 125 MCG (5000 UT) TABS Take 1 tablet by mouth daily.    ? LINZESS 290 MCG CAPS capsule Take 290 mcg by mouth daily.    ? ondansetron (ZOFRAN) 8 MG tablet Take 1/2 to 1 tablet 3 x /day as needed for Nausea 90 tablet 1  ? phentermine (ADIPEX-P) 37.5 MG tablet Take 1/2 to 1 tablet every Morning for Dieting & Weight Loss 90 tablet 1  ? rosuvastatin (CRESTOR) 10 MG tablet Take 1 tablet Daily for Cholesterol 90 tablet 3  ? topiramate (TOPAMAX) 50 MG tablet TAKE 1/2 TO 1 TABLET TWICE DAILY; AT SUPPERTIME AND BEDTIME FOR DIETING AND WEIGHT LOSS 180 tablet 1  ? Dulaglutide (TRULICITY) 3 YB/6.3SL SOPN Inject  3 mg  into skin each week (Patient not taking: Reported on 06/02/2021) 6 mL 3  ? valACYclovir (VALTREX) 500 MG tablet Take 500 mg by mouth daily. Take every other day. (Patient not taking: Reported on 06/02/2021)    ? ?No current facility-administered medications on file prior to visit.  ? ? ?Allergies  ?Allergen Reactions  ? Sulfa Antibiotics  Rash  ? Sulfasalazine Rash  ? ? ?Current Problems (verified) ?Patient Active Problem List  ? Diagnosis Date Noted  ? Posterior vitreous detachment of left eye 06/02/2021  ? CKD stage 2 due to type 2 diabetes mellitus ([place]) 06/02/2021  ? Diet-controlled type 2 diabetes mellitus ([place]) 02/23/2021  ? Hypothyroidism 11/20/2018  ? Vitamin D deficiency disease 11/20/2018  ? Hyperlipidemia associated with type 2 diabetes mellitus ([place]) 11/20/2018  ? Chronic constipation 04/16/2014  ? Redundant colon 04/16/2014  ? ? ?Screening Tests ?Immunization History  ?Administered Date(s) Administered  ? Influenza, High Dose Seasonal PF 12/07/2020  ? PFIZER(Purple Top)SARS-COV-2 Vaccination 01/30/2019, 02/22/2019, 09/30/2019  ? PNEUMOCOCCAL CONJUGATE-20 06/02/2021  ? Tdap 11/20/2018  ? ?Health Maintenance  ?Topic Date Due  ? OPHTHALMOLOGY EXAM  Never done  ? URINE MICROALBUMIN  Never done  ? Zoster Vaccines- Shingrix (1 of 2) Never done  ? DEXA SCAN  08/24/2019  ?  MAMMOGRAM  05/27/2020  ? COVID-19 Vaccine (4 - Booster for Vici series) 06/18/2021 (Originally 11/25/2019)  ? INFLUENZA VACCINE  08/23/2021  ? HEMOGLOBIN A1C  08/23/2021  ? FOOT EXAM  06/03/2022  ? COLONOSCOPY (Pts 4

## 2021-06-02 NOTE — Patient Instructions (Addendum)
?Jennifer Herrera , ?Thank you for taking time to come for your Medicare Wellness Visit. I appreciate your ongoing commitment to your health goals. Please review the following plan we discussed and let me know if I can assist you in the future.  ? ?These are the goals we discussed: ? Goals   ? ?  HEMOGLOBIN A1C < 5.7   ?  LDL CALC < 70   ? ?  ?  ?This is a list of the screening recommended for you and due dates:  ?Health Maintenance  ?Topic Date Due  ? Eye exam for diabetics  Never done  ? Urine Protein Check  Never done  ? Zoster (Shingles) Vaccine (1 of 2) Never done  ? Pneumonia Vaccine (1 - PCV) Never done  ? DEXA scan (bone density measurement)  08/24/2019  ? Mammogram  05/27/2020  ? COVID-19 Vaccine (4 - Booster for Pfizer series) 06/18/2021*  ? Flu Shot  08/23/2021  ? Hemoglobin A1C  08/23/2021  ? Complete foot exam   06/03/2022  ? Colon Cancer Screening  11/19/2024  ? Tetanus Vaccine  11/19/2028  ? Hepatitis C Screening: USPSTF Recommendation to screen - Ages 65-79 yo.  Completed  ? HPV Vaccine  Aged Out  ?*Topic was postponed. The date shown is not the original due date.  ? ? ? ? ?Pneumococcal Conjugate Vaccine (Prevnar 20) Suspension for Injection ?What is this medication? ?PNEUMOCOCCAL VACCINE (NEU mo KOK al vak SEEN) is a vaccine. It prevents pneumococcus bacterial infections. These bacteria can cause serious infections like pneumonia, meningitis, and blood infections. This vaccine will not treat an infection and will not cause infection. This vaccine is recommended for adults 18 years and older. ?This medicine may be used for other purposes; ask your health care provider or pharmacist if you have questions. ?COMMON BRAND NAME(S): Prevnar 20 ?What should I tell my care team before I take this medication? ?They need to know if you have any of these conditions: ?bleeding disorder ?fever ?immune system problems ?an unusual or allergic reaction to pneumococcal vaccine, diphtheria toxoid, other vaccines, other  medicines, foods, dyes, or preservatives ?pregnant or trying to get pregnant ?breast-feeding ?How should I use this medication? ?This vaccine is injected into a muscle. It is given by a health care provider. ?A copy of Vaccine Information Statements will be given before each vaccination. Be sure to read this information carefully each time. This sheet may change often. ?Talk to your health care provider about the use of this medicine in children. Special care may be needed. ?Overdosage: If you think you have taken too much of this medicine contact a poison control center or emergency room at once. ?NOTE: This medicine is only for you. Do not share this medicine with others. ?What if I miss a dose? ?This does not apply. This medicine is not for regular use. ?What may interact with this medication? ?medicines for cancer chemotherapy ?medicines that suppress your immune function ?steroid medicines like prednisone or cortisone ?This list may not describe all possible interactions. Give your health care provider a list of all the medicines, herbs, non-prescription drugs, or dietary supplements you use. Also tell them if you smoke, drink alcohol, or use illegal drugs. Some items may interact with your medicine. ?What should I watch for while using this medication? ?Mild fever and pain should go away in 3 days or less. Report any unusual symptoms to your health care provider. ?What side effects may I notice from receiving this medication? ?Side  effects that you should report to your doctor or health care professional as soon as possible: ?allergic reactions (skin rash, itching or hives; swelling of the face, lips, or tongue) ?confusion ?fast, irregular heartbeat ?fever over 102 degrees F ?muscle weakness ?seizures ?trouble breathing ?unusual bruising or bleeding ?Side effects that usually do not require medical attention (report to your doctor or health care professional if they continue or are bothersome): ?fever of 102  degrees F or less ?headache ?joint pain ?muscle cramps, pain ?pain, tender at site where injected ?This list may not describe all possible side effects. Call your doctor for medical advice about side effects. You may report side effects to FDA at 1-800-FDA-1088. ?Where should I keep my medication? ?This vaccine is only given by a health care provider. It will not be stored at home. ?NOTE: This sheet is a summary. It may not cover all possible information. If you have questions about this medicine, talk to your doctor, pharmacist, or health care provider. ?? 2023 Elsevier/Gold Standard (2019-09-25 00:00:00) ? ?

## 2021-06-03 ENCOUNTER — Other Ambulatory Visit: Payer: Self-pay | Admitting: Adult Health

## 2021-06-03 DIAGNOSIS — E039 Hypothyroidism, unspecified: Secondary | ICD-10-CM

## 2021-06-03 LAB — CBC WITH DIFFERENTIAL/PLATELET
Absolute Monocytes: 586 cells/uL (ref 200–950)
Basophils Absolute: 31 cells/uL (ref 0–200)
Basophils Relative: 0.5 %
Eosinophils Absolute: 122 cells/uL (ref 15–500)
Eosinophils Relative: 2 %
HCT: 40.8 % (ref 35.0–45.0)
Hemoglobin: 13.8 g/dL (ref 11.7–15.5)
Lymphs Abs: 2202 cells/uL (ref 850–3900)
MCH: 31.7 pg (ref 27.0–33.0)
MCHC: 33.8 g/dL (ref 32.0–36.0)
MCV: 93.8 fL (ref 80.0–100.0)
MPV: 11.5 fL (ref 7.5–12.5)
Monocytes Relative: 9.6 %
Neutro Abs: 3160 cells/uL (ref 1500–7800)
Neutrophils Relative %: 51.8 %
Platelets: 224 10*3/uL (ref 140–400)
RBC: 4.35 10*6/uL (ref 3.80–5.10)
RDW: 12 % (ref 11.0–15.0)
Total Lymphocyte: 36.1 %
WBC: 6.1 10*3/uL (ref 3.8–10.8)

## 2021-06-03 LAB — COMPLETE METABOLIC PANEL WITH GFR
AG Ratio: 1.6 (calc) (ref 1.0–2.5)
ALT: 25 U/L (ref 6–29)
AST: 24 U/L (ref 10–35)
Albumin: 3.9 g/dL (ref 3.6–5.1)
Alkaline phosphatase (APISO): 82 U/L (ref 37–153)
BUN: 12 mg/dL (ref 7–25)
CO2: 27 mmol/L (ref 20–32)
Calcium: 9.7 mg/dL (ref 8.6–10.4)
Chloride: 104 mmol/L (ref 98–110)
Creat: 0.82 mg/dL (ref 0.50–1.05)
Globulin: 2.4 g/dL (calc) (ref 1.9–3.7)
Glucose, Bld: 131 mg/dL — ABNORMAL HIGH (ref 65–99)
Potassium: 4.5 mmol/L (ref 3.5–5.3)
Sodium: 138 mmol/L (ref 135–146)
Total Bilirubin: 1.4 mg/dL — ABNORMAL HIGH (ref 0.2–1.2)
Total Protein: 6.3 g/dL (ref 6.1–8.1)
eGFR: 79 mL/min/{1.73_m2} (ref >=60)

## 2021-06-03 LAB — LIPID PANEL
Cholesterol: 107 mg/dL (ref <200)
HDL: 42 mg/dL — ABNORMAL LOW (ref >=50)
LDL Cholesterol (Calc): 50 mg/dL (calc)
Non-HDL Cholesterol (Calc): 65 mg/dL (calc) (ref <130)
Total CHOL/HDL Ratio: 2.5 (calc) (ref <5.0)
Triglycerides: 70 mg/dL (ref <150)

## 2021-06-03 LAB — TSH: TSH: <0.01 mIU/L — ABNORMAL LOW (ref 0.40–4.50)

## 2021-06-03 MED ORDER — LEVOTHYROXINE SODIUM 88 MCG PO TABS: ORAL_TABLET | ORAL | 3 refills | Status: DC

## 2021-06-09 ENCOUNTER — Encounter: Payer: Self-pay | Admitting: Adult Health

## 2021-06-09 DIAGNOSIS — M8580 Other specified disorders of bone density and structure, unspecified site: Secondary | ICD-10-CM | POA: Insufficient documentation

## 2021-06-09 DIAGNOSIS — M85851 Other specified disorders of bone density and structure, right thigh: Secondary | ICD-10-CM | POA: Insufficient documentation

## 2021-06-13 ENCOUNTER — Encounter: Payer: Self-pay | Admitting: Internal Medicine

## 2021-07-28 ENCOUNTER — Encounter: Payer: Self-pay | Admitting: Internal Medicine

## 2021-07-28 ENCOUNTER — Other Ambulatory Visit: Payer: Self-pay | Admitting: Nurse Practitioner

## 2021-07-28 MED ORDER — ROSUVASTATIN CALCIUM 10 MG PO TABS: ORAL_TABLET | ORAL | 3 refills | Status: DC

## 2021-09-06 ENCOUNTER — Encounter: Payer: Self-pay | Admitting: Internal Medicine

## 2021-09-06 ENCOUNTER — Ambulatory Visit (INDEPENDENT_AMBULATORY_CARE_PROVIDER_SITE_OTHER): Payer: BC Managed Care – PPO | Admitting: Internal Medicine

## 2021-09-06 VITALS — BP 100/62 | HR 72 | Temp 97.7°F | Resp 16 | Ht 66.0 in | Wt 132.4 lb

## 2021-09-06 DIAGNOSIS — E1122 Type 2 diabetes mellitus with diabetic chronic kidney disease: Secondary | ICD-10-CM | POA: Diagnosis not present

## 2021-09-06 DIAGNOSIS — R030 Elevated blood-pressure reading, without diagnosis of hypertension: Secondary | ICD-10-CM

## 2021-09-06 DIAGNOSIS — E1169 Type 2 diabetes mellitus with other specified complication: Secondary | ICD-10-CM | POA: Diagnosis not present

## 2021-09-06 DIAGNOSIS — E039 Hypothyroidism, unspecified: Secondary | ICD-10-CM

## 2021-09-06 DIAGNOSIS — E559 Vitamin D deficiency, unspecified: Secondary | ICD-10-CM | POA: Diagnosis not present

## 2021-09-06 DIAGNOSIS — Z79899 Other long term (current) drug therapy: Secondary | ICD-10-CM | POA: Diagnosis not present

## 2021-09-06 DIAGNOSIS — E785 Hyperlipidemia, unspecified: Secondary | ICD-10-CM

## 2021-09-06 DIAGNOSIS — N182 Chronic kidney disease, stage 2 (mild): Secondary | ICD-10-CM

## 2021-09-06 MED ORDER — TRULICITY 1.5 MG/0.5ML ~~LOC~~ SOAJ: SUBCUTANEOUS | 0 refills | Status: DC

## 2021-09-06 MED ORDER — PHENTERMINE HCL 37.5 MG PO TABS: ORAL_TABLET | ORAL | 1 refills | Status: DC

## 2021-09-06 NOTE — Progress Notes (Unsigned)
Future Appointments  Date Time Provider Department  09/06/2021  4:00 PM Lucky Cowboy, MD [facility]  12/08/2021                 cpe  3:00 PM Lucky Cowboy, MD [facility]  06/05/2022                  wellness  4:00 PM Adela Glimpse, NP [facility]    History of Present Illness:       This very nice 66 y.o. MWF  presents to establish care in this practice . Patient had been on desiccated thyroid for  dx/o hypothyroidism  circa 2010 by Dr Hassel Neth (now retired) for also helping with weight loss. And she also prescribed patient  metformin for an established dx/o prediabetes  (A1c's 5.8-6.1%). She has been prescribed estrogen and also testosterone injections for unclear reasons.        Patient is screened for elevated BP & today's BP is at goal - 113/85. Patient has had no complaints of any cardiac type chest pain, palpitations, dyspnea / orthopnea / PND, dizziness, claudication, or dependent edema.       Hyperlipidemia is not controlled with diet. Last Lipids were not at goal :  Lab Results  Component Value Date   CHOL 206 (H) 06/24/2020   HDL 49 (L) 06/24/2020   LDLCALC 125 (H) 06/24/2020   TRIG 206 (H) 06/24/2020   CHOLHDL 4.2 06/24/2020     As above, the patient has history of PreDiabetes (2010)   and she was started on Metformin by Dr Allena Napoleon.   She has had no symptoms of reactive hypoglycemia, diabetic polys, paresthesias or visual blurring.  Last A1c was still not at goal :  Lab Results  Component Value Date   HGBA1C 5.8 (H) 06/24/2020                                                          Further, the patient also has history of Vitamin D Deficiency and supplements vitamin D without any suspected side-effects. Last vitamin D was at goal:  Lab Results  Component Value Date   VD25OH 81 04/17/2019     Current Outpatient Medications on File Prior to Visit  Medication Sig   VITAMIN D 5000 u Take 1 table daily.   metFORMIN  500 MG tablet Take  1 tablet  daily with breakfast.   Testosterone cypio in olive  oil (25 mg/mL).   Inject 5 mg into the muscle 2 times a week. Dispense 5 mL vial.   - Off x 2 + weeks    valACYclovir  500 MG tablet Take 500 mg by mouth daily. Take every other day.    (Patient not taking: Reported on 12/07/2020)     Allergies  Allergen Reactions   Sulfa Antibiotics Rash   Sulfasalazine Rash     PMHx:      Immunization History  Administered Date(s) Administered   PFIZER SARS-COV-2 Vacc 01/30/2019, 02/22/2019, 09/30/2019   Tdap 11/20/2018     Past Surgical History:  Procedure Laterality Date   ANAL RECTAL MANOMETRY N/A 08/15/2019   Procedure: ANO RECTAL MANOMETRY;  Surgeon: Charlott Rakes, MD;  Location: [facility];  Service: Endoscopy;  Laterality: N/A;   AUGMENTATION MAMMAPLASTY Bilateral 1979   saline   BREAST  ENHANCEMENT SURGERY     FACIAL COSMETIC SURGERY     SHOULDER ARTHROSCOPY     TONSILLECTOMY      FHx:    Reviewed / unchanged  SHx:    Reviewed / unchanged   Systems Review:  Constitutional: Denies fever, chills, wt changes, headaches, insomnia, fatigue, night sweats, change in appetite. Eyes: Denies redness, blurred vision, diplopia, discharge, itchy, watery eyes.  ENT: Denies discharge, congestion, post nasal drip, epistaxis, sore throat, earache, hearing loss, dental pain, tinnitus, vertigo, sinus pain, snoring.  CV: Denies chest pain, palpitations, irregular heartbeat, syncope, dyspnea, diaphoresis, orthopnea, PND, claudication or edema. Respiratory: denies cough, dyspnea, DOE, pleurisy, hoarseness, laryngitis, wheezing.  Gastrointestinal: Denies dysphagia, odynophagia, heartburn, reflux, water brash, abdominal pain or cramps, nausea, vomiting, bloating, diarrhea, constipation, hematemesis, melena, hematochezia  or hemorrhoids. Genitourinary: Denies dysuria, frequency, urgency, nocturia, hesitancy, discharge, hematuria or flank pain. Musculoskeletal: Denies arthralgias,  myalgias, stiffness, jt. swelling, pain, limping or strain/sprain.  Skin: Denies pruritus, rash, hives, warts, acne, eczema or change in skin lesion(s). Neuro: No weakness, tremor, incoordination, spasms, paresthesia or pain. Psychiatric: Denies confusion, memory loss or sensory loss. Endo: Denies change in weight, skin or hair change.  Heme/Lymph: No excessive bleeding, bruising or enlarged lymph nodes.  Physical Exam  There were no vitals taken for this visit.  Appears  well nourished, well groomed  and in no distress.  Eyes: PERRLA, EOMs, conjunctiva no swelling or erythema. Sinuses: No frontal/maxillary tenderness ENT/Mouth: EAC's clear, TM's nl w/o erythema, bulging. Nares clear w/o erythema, swelling, exudates. Oropharynx clear without erythema or exudates. Oral hygiene is good. Tongue normal, non obstructing. Hearing intact.  Neck: Supple. Thyroid not palpable. Car 2+/2+ without bruits, nodes or JVD. Chest: Respirations nl with BS clear & equal w/o rales, rhonchi, wheezing or stridor.  Cor: Heart sounds normal w/ regular rate and rhythm without sig. murmurs, gallops, clicks or rubs. Peripheral pulses normal and equal  without edema.  Abdomen: Soft & bowel sounds normal. Non-tender w/o guarding, rebound, hernias, masses or organomegaly.  Lymphatics: Unremarkable.  Musculoskeletal: Full ROM all peripheral extremities, joint stability, 5/5 strength and normal gait.  Skin: Warm, dry without exposed rashes, lesions or ecchymosis apparent.  Neuro: Cranial nerves intact, reflexes equal bilaterally. Sensory-motor testing grossly intact. Tendon reflexes grossly intact.  Pysch: Alert & oriented x 3.  Insight and judgement nl & appropriate. No ideations.  Assessment and Plan:  1. Hypertension screen  -  monitor blood pressure at home.  - Continue diet.  Reminder to go to the ER if any CP,  SOB, nausea, dizziness, severe HA, changes vision/speech.   - CBC with Differential/Platelet -  COMPLETE METABOLIC PANEL WITH GFR - Magnesium  2. Hyperlipidemia, mixed  - Continue diet, exercise,& lifestyle modifications.  - Continue monitor periodic cholesterol/liver & renal functions    - Lipid panel - TSH  3. Abnormal glucose  - Continue diet, exercise  - Lifestyle modifications.  - Monitor appropriate labs   - Hemoglobin A1c - Insulin, random  4. Vitamin D deficiency  - Continue supplementation  - VITAMIN D 25 Hydroxy   5. Hypothyroidism, by hx  - TSH  6. Prediabetes  - Hemoglobin A1c - Insulin, random  7. Fatigue, unspecified type  - CBC with Differential/Platelet - TSH  8. Need for immunization against influenza  - Flu vaccine HIGH DOSE PF (Fluzone High dose)  9. Medication management  - CBC with Differential/Platelet - COMPLETE METABOLIC PANEL WITH GFR - Magnesium - Lipid panel - TSH -  Hemoglobin A1c - Insulin, random - VITAMIN D 25 Hydroxy          Discussed  regular exercise, BP monitoring, weight control to achieve/maintain BMI less than 25 and discussed med and SE's. Recommended labs to assess and monitor clinical status with further disposition pending results of labs.  I discussed the assessment and treatment plan with the patient. The patient was provided an opportunity to ask questions and all were answered. The patient agreed with the plan and demonstrated an understanding of the instructions.  I provided over 50 minutes of exam, counseling, chart review and  complex critical decision making.        The patient was advised to call back or seek an in-person evaluation if the symptoms worsen or if the condition fails to improve as anticipated.     Marinus Maw, MD

## 2021-09-06 NOTE — Progress Notes (Incomplete)
Future Appointments  Date Time Provider Department  09/06/2021  4:00 PM Lucky Cowboy, MD [facility]  12/08/2021                 cpe  3:00 PM Lucky Cowboy, MD [facility]  06/05/2022                  wellness  4:00 PM Adela Glimpse, NP [facility]    History of Present Illness:       This very nice 66 y.o. MWF  presents to establish care in this practice . Patient had been on desiccated thyroid for  dx/o hypothyroidism  circa 2010 by Dr Hassel Neth (now retired) for also helping with weight loss. And she also prescribed patient  Metformin for an established dx/o diabetes  (A1c's 5.8-6.1%). She has been prescribed estrogen and also testosterone injections for unclear reasons.        Patient is screened for elevated BP & today's BP is at goal - 113/85. Patient has had no complaints of any cardiac type chest pain, palpitations, dyspnea / orthopnea / PND, dizziness, claudication, or dependent edema.       Hyperlipidemia is not controlled with diet. Last Lipids were not at goal :  Lab Results  Component Value Date   CHOL 206 (H) 06/24/2020   HDL 49 (L) 06/24/2020   LDLCALC 125 (H) 06/24/2020   TRIG 206 (H) 06/24/2020   CHOLHDL 4.2 06/24/2020     As above, the patient has history of PreDiabetes (2010)   and she was started on Metformin by Dr Allena Napoleon.   She has had no symptoms of reactive hypoglycemia, diabetic polys, paresthesias or visual blurring.  Last A1c was still not at goal :  Lab Results  Component Value Date   HGBA1C 5.8 (H) 06/24/2020                                                          Further, the patient also has history of Vitamin D Deficiency and supplements vitamin D without any suspected side-effects. Last vitamin D was at goal:  Lab Results  Component Value Date   VD25OH 21 04/17/2019     Current Outpatient Medications on File Prior to Visit  Medication Sig  . VITAMIN D 5000 u Take 1 table daily.  . metFORMIN  500 MG tablet Take 1  tablet  daily with breakfast.  . Testosterone cypio in olive  oil (25 mg/mL).   Inject 5 mg into the muscle 2 times a week. Dispense 5 mL vial.  . - Off x 2 + weeks   . valACYclovir  500 MG tablet Take 500 mg by mouth daily. Take every other day.  . . (Patient not taking: Reported on 12/07/2020)     Allergies  Allergen Reactions  . Sulfa Antibiotics Rash  . Sulfasalazine Rash     PMHx:      Immunization History  Administered Date(s) Administered  . PFIZER SARS-COV-2 Vacc 01/30/2019, 02/22/2019, 09/30/2019  . Tdap 11/20/2018     Past Surgical History:  Procedure Laterality Date  . ANAL RECTAL MANOMETRY N/A 08/15/2019   Procedure: ANO RECTAL MANOMETRY;  Surgeon: Charlott Rakes, MD;  Location: [facility];  Service: Endoscopy;  Laterality: N/A;  . AUGMENTATION MAMMAPLASTY Bilateral 1979   saline  . BREAST  ENHANCEMENT SURGERY    . FACIAL COSMETIC SURGERY    . SHOULDER ARTHROSCOPY    . TONSILLECTOMY      FHx:    Reviewed / unchanged  SHx:    Reviewed / unchanged   Systems Review:  Constitutional: Denies fever, chills, wt changes, headaches, insomnia, fatigue, night sweats, change in appetite. Eyes: Denies redness, blurred vision, diplopia, discharge, itchy, watery eyes.  ENT: Denies discharge, congestion, post nasal drip, epistaxis, sore throat, earache, hearing loss, dental pain, tinnitus, vertigo, sinus pain, snoring.  CV: Denies chest pain, palpitations, irregular heartbeat, syncope, dyspnea, diaphoresis, orthopnea, PND, claudication or edema. Respiratory: denies cough, dyspnea, DOE, pleurisy, hoarseness, laryngitis, wheezing.  Gastrointestinal: Denies dysphagia, odynophagia, heartburn, reflux, water brash, abdominal pain or cramps, nausea, vomiting, bloating, diarrhea, constipation, hematemesis, melena, hematochezia  or hemorrhoids. Genitourinary: Denies dysuria, frequency, urgency, nocturia, hesitancy, discharge, hematuria or flank pain. Musculoskeletal: Denies  arthralgias, myalgias, stiffness, jt. swelling, pain, limping or strain/sprain.  Skin: Denies pruritus, rash, hives, warts, acne, eczema or change in skin lesion(s). Neuro: No weakness, tremor, incoordination, spasms, paresthesia or pain. Psychiatric: Denies confusion, memory loss or sensory loss. Endo: Denies change in weight, skin or hair change.  Heme/Lymph: No excessive bleeding, bruising or enlarged lymph nodes.  Physical Exam  There were no vitals taken for this visit.  Appears  well nourished, well groomed  and in no distress.  Eyes: PERRLA, EOMs, conjunctiva no swelling or erythema. Sinuses: No frontal/maxillary tenderness ENT/Mouth: EAC's clear, TM's nl w/o erythema, bulging. Nares clear w/o erythema, swelling, exudates. Oropharynx clear without erythema or exudates. Oral hygiene is good. Tongue normal, non obstructing. Hearing intact.  Neck: Supple. Thyroid not palpable. Car 2+/2+ without bruits, nodes or JVD. Chest: Respirations nl with BS clear & equal w/o rales, rhonchi, wheezing or stridor.  Cor: Heart sounds normal w/ regular rate and rhythm without sig. murmurs, gallops, clicks or rubs. Peripheral pulses normal and equal  without edema.  Abdomen: Soft & bowel sounds normal. Non-tender w/o guarding, rebound, hernias, masses or organomegaly.  Lymphatics: Unremarkable.  Musculoskeletal: Full ROM all peripheral extremities, joint stability, 5/5 strength and normal gait.  Skin: Warm, dry without exposed rashes, lesions or ecchymosis apparent.  Neuro: Cranial nerves intact, reflexes equal bilaterally. Sensory-motor testing grossly intact. Tendon reflexes grossly intact.  Pysch: Alert & oriented x 3.  Insight and judgement nl & appropriate. No ideations.  Assessment and Plan:  1. Hypertension screen  -  monitor blood pressure at home.  - Continue diet.  Reminder to go to the ER if any CP,  SOB, nausea, dizziness, severe HA, changes vision/speech.   - CBC with  Differential/Platelet - COMPLETE METABOLIC PANEL WITH GFR - Magnesium  2. Hyperlipidemia, mixed  - Continue diet, exercise,& lifestyle modifications.  - Continue monitor periodic cholesterol/liver & renal functions    - Lipid panel - TSH  3. Abnormal glucose  - Continue diet, exercise  - Lifestyle modifications.  - Monitor appropriate labs   - Hemoglobin A1c - Insulin, random  4. Vitamin D deficiency  - Continue supplementation  - VITAMIN D 25 Hydroxy   5. Hypothyroidism, by hx  - TSH  6. Prediabetes  - Hemoglobin A1c - Insulin, random  7. Fatigue, unspecified type  - CBC with Differential/Platelet - TSH  8. Need for immunization against influenza  - Flu vaccine HIGH DOSE PF (Fluzone High dose)  9. Medication management  - CBC with Differential/Platelet - COMPLETE METABOLIC PANEL WITH GFR - Magnesium - Lipid panel - TSH -  Hemoglobin A1c - Insulin, random - VITAMIN D 25 Hydroxy          Discussed  regular exercise, BP monitoring, weight control to achieve/maintain BMI less than 25 and discussed med and SE's. Recommended labs to assess and monitor clinical status with further disposition pending results of labs.  I discussed the assessment and treatment plan with the patient. The patient was provided an opportunity to ask questions and all were answered. The patient agreed with the plan and demonstrated an understanding of the instructions.  I provided over 50 minutes of exam, counseling, chart review and  complex critical decision making.        The patient was advised to call back or seek an in-person evaluation if the symptoms worsen or if the condition fails to improve as anticipated.     Marinus Maw, MD

## 2021-09-06 NOTE — Patient Instructions (Signed)

## 2021-09-06 NOTE — Progress Notes (Unsigned)
History of Present Illness:       This very nice 66 y.o. MWF  presents to establish care in this practice . Patient had been on desiccated thyroid for an alleged  dx/o hypothyroidism  circa 2010 by Dr Hassel Neth (now retired) for also helping with weight loss. And she also prescribed patient  metformin for an established dx/o prediabetes  (A1c's 5.8-6.1%). She has been prescribed estrogen and also testosterone injections for unclear reasons.        Patient is screened for elevated BP & today's BP is at goal - 113/85. Patient has had no complaints of any cardiac type chest pain, palpitations, dyspnea / orthopnea / PND, dizziness, claudication, or dependent edema.       Hyperlipidemia is not controlled with diet. Last Lipids were not at goal :  Lab Results  Component Value Date   CHOL 206 (H) 06/24/2020   HDL 49 (L) 06/24/2020   LDLCALC 125 (H) 06/24/2020   TRIG 206 (H) 06/24/2020   CHOLHDL 4.2 06/24/2020     As above, the patient has history of PreDiabetes (2010)   and she was started on Metformin by Dr Allena Napoleon.   She has had no symptoms of reactive hypoglycemia, diabetic polys, paresthesias or visual blurring.  Last A1c was still not at goal:  Lab Results  Component Value Date   HGBA1C 5.8 (H) 06/24/2020                                                          Further, the patient also has history of Vitamin D Deficiency and supplements vitamin D without any suspected side-effects. Last vitamin D was at goal:  Lab Results  Component Value Date   VD25OH 81 04/17/2019      Current Outpatient Medications:    Cholecalciferol (VITAMIN D-3) 125 MCG (5000 UT) TABS, Take 1 tablet by mouth daily., Disp: , Rfl:    Dulaglutide (TRULICITY) 3 MG/0.5ML SOPN, Inject  3 mg  into skin each week (Patient not taking: Reported on 06/02/2021), Disp: 6 mL, Rfl: 3   levothyroxine (SYNTHROID) 88 MCG tablet, Take  1 tablet  Daily except take 1/2 tab on Mon/Thurs. Take on an empty  stomach with only water for 30 minutes & no Antacid meds, Calcium or Magnesium for 4 hours & avoid Biotin, Disp: 90 tablet, Rfl: 3   LINZESS 290 MCG CAPS capsule, Take 290 mcg by mouth daily., Disp: , Rfl:    ondansetron (ZOFRAN) 8 MG tablet, Take 1/2 to 1 tablet 3 x /day as needed for Nausea, Disp: 90 tablet, Rfl: 1   phentermine (ADIPEX-P) 37.5 MG tablet, Take 1/2 to 1 tablet every Morning for Dieting & Weight Loss, Disp: 90 tablet, Rfl: 1   rosuvastatin (CRESTOR) 10 MG tablet, Take 1 tablet Daily for Cholesterol, Disp: 90 tablet, Rfl: 3   topiramate (TOPAMAX) 50 MG tablet, TAKE 1/2 TO 1 TABLET TWICE DAILY; AT SUPPERTIME AND BEDTIME FOR DIETING AND WEIGHT LOSS, Disp: 180 tablet, Rfl: 1   valACYclovir (VALTREX) 500 MG tablet, Take 500 mg by mouth daily. Take every other day. (Patient not taking: Reported on 06/02/2021), Disp: , Rfl:     Allergies  Allergen Reactions   Sulfa Antibiotics Rash   Sulfasalazine Rash     PMHx:  Immunization History  Administered Date(s) Administered   PFIZER SARS-COV-2 Vacc 01/30/2019, 02/22/2019, 09/30/2019   Tdap 11/20/2018     Past Surgical History:  Procedure Laterality Date   ANAL RECTAL MANOMETRY N/A 08/15/2019   Procedure: ANO RECTAL MANOMETRY;  Surgeon: Charlott Rakes, MD;  Location: [facility];  Service: Endoscopy;  Laterality: N/A;   AUGMENTATION MAMMAPLASTY Bilateral 1979   saline   BREAST ENHANCEMENT SURGERY     FACIAL COSMETIC SURGERY     SHOULDER ARTHROSCOPY     TONSILLECTOMY      FHx:    Reviewed / unchanged  SHx:    Reviewed / unchanged   Systems Review:  Constitutional: Denies fever, chills, wt changes, headaches, insomnia, fatigue, night sweats, change in appetite. Eyes: Denies redness, blurred vision, diplopia, discharge, itchy, watery eyes.  ENT: Denies discharge, congestion, post nasal drip, epistaxis, sore throat, earache, hearing loss, dental pain, tinnitus, vertigo, sinus pain, snoring.  CV: Denies chest  pain, palpitations, irregular heartbeat, syncope, dyspnea, diaphoresis, orthopnea, PND, claudication or edema. Respiratory: denies cough, dyspnea, DOE, pleurisy, hoarseness, laryngitis, wheezing.  Gastrointestinal: Denies dysphagia, odynophagia, heartburn, reflux, water brash, abdominal pain or cramps, nausea, vomiting, bloating, diarrhea, constipation, hematemesis, melena, hematochezia  or hemorrhoids. Genitourinary: Denies dysuria, frequency, urgency, nocturia, hesitancy, discharge, hematuria or flank pain. Musculoskeletal: Denies arthralgias, myalgias, stiffness, jt. swelling, pain, limping or strain/sprain.  Skin: Denies pruritus, rash, hives, warts, acne, eczema or change in skin lesion(s). Neuro: No weakness, tremor, incoordination, spasms, paresthesia or pain. Psychiatric: Denies confusion, memory loss or sensory loss. Endo: Denies change in weight, skin or hair change.  Heme/Lymph: No excessive bleeding, bruising or enlarged lymph nodes.  Physical Exam  There were no vitals taken for this visit.  Appears  well nourished, well groomed  and in no distress.  Eyes: PERRLA, EOMs, conjunctiva no swelling or erythema. Sinuses: No frontal/maxillary tenderness ENT/Mouth: EAC's clear, TM's nl w/o erythema, bulging. Nares clear w/o erythema, swelling, exudates. Oropharynx clear without erythema or exudates. Oral hygiene is good. Tongue normal, non obstructing. Hearing intact.  Neck: Supple. Thyroid not palpable. Car 2+/2+ without bruits, nodes or JVD. Chest: Respirations nl with BS clear & equal w/o rales, rhonchi, wheezing or stridor.  Cor: Heart sounds normal w/ regular rate and rhythm without sig. murmurs, gallops, clicks or rubs. Peripheral pulses normal and equal  without edema.  Abdomen: Soft & bowel sounds normal. Non-tender w/o guarding, rebound, hernias, masses or organomegaly.  Lymphatics: Unremarkable.  Musculoskeletal: Full ROM all peripheral extremities, joint stability, 5/5  strength and normal gait.  Skin: Warm, dry without exposed rashes, lesions or ecchymosis apparent.  Neuro: Cranial nerves intact, reflexes equal bilaterally. Sensory-motor testing grossly intact. Tendon reflexes grossly intact.  Pysch: Alert & oriented x 3.  Insight and judgement nl & appropriate. No ideations.  Assessment and Plan:  1. Hypertension screen  -  monitor blood pressure at home.  - Continue diet.  Reminder to go to the ER if any CP,  SOB, nausea, dizziness, severe HA, changes vision/speech.   - CBC with Differential/Platelet - COMPLETE METABOLIC PANEL WITH GFR - Magnesium  2. Hyperlipidemia, mixed  - Continue diet, exercise,& lifestyle modifications.  - Continue monitor periodic cholesterol/liver & renal functions    - Lipid panel - TSH  3. Abnormal glucose  - Continue diet, exercise  - Lifestyle modifications.  - Monitor appropriate labs   - Hemoglobin A1c - Insulin, random  4. Vitamin D deficiency  - Continue supplementation  - VITAMIN D 25 Hydroxy  5. Hypothyroidism, by hx  - TSH  6. Prediabetes  - Hemoglobin A1c - Insulin, random  7. Fatigue, unspecified type  - CBC with Differential/Platelet - TSH  8. Need for immunization against influenza  - Flu vaccine HIGH DOSE PF (Fluzone High dose)  9. Medication management  - CBC with Differential/Platelet - COMPLETE METABOLIC PANEL WITH GFR - Magnesium - Lipid panel - TSH - Hemoglobin A1c - Insulin, random - VITAMIN D 25 Hydroxy          Discussed  regular exercise, BP monitoring, weight control to achieve/maintain BMI less than 25 and discussed med and SE's. Recommended labs to assess and monitor clinical status with further disposition pending results of labs.  I discussed the assessment and treatment plan with the patient. The patient was provided an opportunity to ask questions and all were answered. The patient agreed with the plan and demonstrated an understanding of the  instructions.  I provided over 50 minutes of exam, counseling, chart review and  complex critical decision making.        The patient was advised to call back or seek an in-person evaluation if the symptoms worsen or if the condition fails to improve as anticipated.     Marinus Maw, MD

## 2021-09-07 ENCOUNTER — Telehealth: Payer: Self-pay

## 2021-09-07 ENCOUNTER — Other Ambulatory Visit: Payer: Self-pay | Admitting: Internal Medicine

## 2021-09-07 DIAGNOSIS — E039 Hypothyroidism, unspecified: Secondary | ICD-10-CM

## 2021-09-07 LAB — CBC WITH DIFFERENTIAL/PLATELET
Absolute Monocytes: 458 cells/uL (ref 200–950)
Basophils Absolute: 31 cells/uL (ref 0–200)
Basophils Relative: 0.5 %
Eosinophils Absolute: 79 cells/uL (ref 15–500)
Eosinophils Relative: 1.3 %
HCT: 39 % (ref 35.0–45.0)
Hemoglobin: 13.3 g/dL (ref 11.7–15.5)
Lymphs Abs: 2458 cells/uL (ref 850–3900)
MCH: 31.6 pg (ref 27.0–33.0)
MCHC: 34.1 g/dL (ref 32.0–36.0)
MCV: 92.6 fL (ref 80.0–100.0)
MPV: 10.7 fL (ref 7.5–12.5)
Monocytes Relative: 7.5 %
Neutro Abs: 3074 cells/uL (ref 1500–7800)
Neutrophils Relative %: 50.4 %
Platelets: 256 10*3/uL (ref 140–400)
RBC: 4.21 10*6/uL (ref 3.80–5.10)
RDW: 12 % (ref 11.0–15.0)
Total Lymphocyte: 40.3 %
WBC: 6.1 10*3/uL (ref 3.8–10.8)

## 2021-09-07 LAB — COMPLETE METABOLIC PANEL WITH GFR
AG Ratio: 1.6 (calc) (ref 1.0–2.5)
ALT: 15 U/L (ref 6–29)
AST: 18 U/L (ref 10–35)
Albumin: 3.9 g/dL (ref 3.6–5.1)
Alkaline phosphatase (APISO): 89 U/L (ref 37–153)
BUN/Creatinine Ratio: 12 (calc) (ref 6–22)
BUN: 13 mg/dL (ref 7–25)
CO2: 28 mmol/L (ref 20–32)
Calcium: 9.3 mg/dL (ref 8.6–10.4)
Chloride: 105 mmol/L (ref 98–110)
Creat: 1.06 mg/dL — ABNORMAL HIGH (ref 0.50–1.05)
Globulin: 2.5 g/dL (calc) (ref 1.9–3.7)
Glucose, Bld: 92 mg/dL (ref 65–99)
Potassium: 4.3 mmol/L (ref 3.5–5.3)
Sodium: 140 mmol/L (ref 135–146)
Total Bilirubin: 1.1 mg/dL (ref 0.2–1.2)
Total Protein: 6.4 g/dL (ref 6.1–8.1)
eGFR: 58 mL/min/{1.73_m2} — ABNORMAL LOW (ref >=60)

## 2021-09-07 LAB — LIPID PANEL
Cholesterol: 112 mg/dL (ref <200)
HDL: 53 mg/dL (ref >=50)
LDL Cholesterol (Calc): 45 mg/dL (calc)
Non-HDL Cholesterol (Calc): 59 mg/dL (calc) (ref <130)
Total CHOL/HDL Ratio: 2.1 (calc) (ref <5.0)
Triglycerides: 56 mg/dL (ref <150)

## 2021-09-07 LAB — HEMOGLOBIN A1C
Hgb A1c MFr Bld: 5.8 % of total Hgb — ABNORMAL HIGH (ref <5.7)
Mean Plasma Glucose: 120 mg/dL
eAG (mmol/L): 6.6 mmol/L

## 2021-09-07 LAB — MAGNESIUM: Magnesium: 2.2 mg/dL (ref 1.5–2.5)

## 2021-09-07 LAB — INSULIN, RANDOM: Insulin: 18.7 u[IU]/mL — ABNORMAL HIGH

## 2021-09-07 LAB — TSH: TSH: 0.01 mIU/L — ABNORMAL LOW (ref 0.40–4.50)

## 2021-09-07 LAB — VITAMIN D 25 HYDROXY (VIT D DEFICIENCY, FRACTURES): Vit D, 25-Hydroxy: 80 ng/mL (ref 30–100)

## 2021-09-07 NOTE — Progress Notes (Signed)
<><><><><><><><><><><><><><><><><><><><><><><><><><><><><><><><><> <><><><><><><><><><><><><><><><><><><><><><><><><><><><><><><><><> -   Test results slightly outside the reference range are not unusual. If there is anything important, I will review this with you,  otherwise it is considered normal test values.  If you have further questions,  please do not hesitate to contact me at the office or via My Chart.  <><><><><><><><><><><><><><><><><><><><><><><><><><><><><><><><><> <><><><><><><><><><><><><><><><><><><><><><><><><><><><><><><><><>  -  TSH is very low which means thyroid hormone is very high in blood . So . . . .  - Stop your levothyroxine 88  6 tabs /week  & leave off for 5 days   -Then when restart in 6 days  - only take 1/2 tablet ( = 44 mcg) every day   - Please call office to schedule a Nurse Visit in 1 month to recheck     TSH level  <><><><><><><><><><><><><><><><><><><><><><><><><><><><><><><><><> <><><><><><><><><><><><><><><><><><><><><><><><><><><><><><><><><>  -  A1c = 5.8% - still elevated in the early Prediabetic range, So    - Avoid Sweets, Candy & White Stuff   - White [place], White [place], White Flour  - Breads &  Pasta <><><><><><><><><><><><><><><><><><><><><><><><><><><><><><><><><> <><><><><><><><><><><><><><><><><><><><><><><><><><><><><><><><><>  -  Total Chol = 112   &   LDL = 45   - Both  Excellent   - Very low risk for Heart Attack  / Stroke <><><><><><><><><><><><><><><><><><><><><><><><><><><><><><><><><> <><><><><><><><><><><><><><><><><><><><><><><><><><><><><><><><><>  - All Else - CBC - Kidneys - Electrolytes - Liver &  Magnesium   - all  Normal / OK  <><><><><><><><><><><><><><><><><><><><><><><><><><><><><><><><><> <><><><><><><><><><><><><><><><><><><><><><><><><><><><><><><><><>

## 2021-09-07 NOTE — Telephone Encounter (Signed)
Prior authorization submitted for Trulicity through Cover [employer].

## 2021-09-15 DIAGNOSIS — K5901 Slow transit constipation: Secondary | ICD-10-CM | POA: Diagnosis not present

## 2021-09-15 DIAGNOSIS — K5904 Chronic idiopathic constipation: Secondary | ICD-10-CM | POA: Diagnosis not present

## 2021-09-20 ENCOUNTER — Emergency Department (HOSPITAL_BASED_OUTPATIENT_CLINIC_OR_DEPARTMENT_OTHER)
Admission: EM | Admit: 2021-09-20 | Discharge: 2021-09-20 | Disposition: A | Discharge status: Home / Self Care | Payer: BC Managed Care – PPO | Attending: Emergency Medicine | Admitting: Emergency Medicine

## 2021-09-20 ENCOUNTER — Other Ambulatory Visit: Payer: Self-pay

## 2021-09-20 ENCOUNTER — Encounter (HOSPITAL_COMMUNITY): Payer: Self-pay

## 2021-09-20 ENCOUNTER — Ambulatory Visit (HOSPITAL_COMMUNITY)
Admission: EM | Admit: 2021-09-20 | Discharge: 2021-09-20 | Disposition: A | Discharge status: Home / Self Care | Payer: BC Managed Care – PPO | Attending: Family Medicine | Admitting: Family Medicine

## 2021-09-20 ENCOUNTER — Encounter (HOSPITAL_BASED_OUTPATIENT_CLINIC_OR_DEPARTMENT_OTHER): Payer: Self-pay

## 2021-09-20 ENCOUNTER — Emergency Department (HOSPITAL_BASED_OUTPATIENT_CLINIC_OR_DEPARTMENT_OTHER): Payer: BC Managed Care – PPO

## 2021-09-20 DIAGNOSIS — N200 Calculus of kidney: Secondary | ICD-10-CM | POA: Diagnosis not present

## 2021-09-20 DIAGNOSIS — R1032 Left lower quadrant pain: Secondary | ICD-10-CM | POA: Diagnosis not present

## 2021-09-20 DIAGNOSIS — N132 Hydronephrosis with renal and ureteral calculous obstruction: Secondary | ICD-10-CM | POA: Diagnosis not present

## 2021-09-20 HISTORY — DX: Irritable bowel syndrome, unspecified: K58.9

## 2021-09-20 LAB — CBC
HCT: 43.2 % (ref 36.0–46.0)
Hemoglobin: 14.8 g/dL (ref 12.0–15.0)
MCH: 31.6 pg (ref 26.0–34.0)
MCHC: 34.3 g/dL (ref 30.0–36.0)
MCV: 92.1 fL (ref 80.0–100.0)
Platelets: 290 10*3/uL (ref 150–400)
RBC: 4.69 MIL/uL (ref 3.87–5.11)
RDW: 13.1 % (ref 11.5–15.5)
WBC: 12.3 10*3/uL — ABNORMAL HIGH (ref 4.0–10.5)
nRBC: 0 % (ref 0.0–0.2)

## 2021-09-20 LAB — COMPREHENSIVE METABOLIC PANEL
ALT: 13 U/L (ref 0–44)
AST: 18 U/L (ref 15–41)
Albumin: 4.5 g/dL (ref 3.5–5.0)
Alkaline Phosphatase: 94 U/L (ref 38–126)
Anion gap: 13 (ref 5–15)
BUN: 12 mg/dL (ref 8–23)
CO2: 22 mmol/L (ref 22–32)
Calcium: 10.1 mg/dL (ref 8.9–10.3)
Chloride: 102 mmol/L (ref 98–111)
Creatinine, Ser: 0.83 mg/dL (ref 0.44–1.00)
GFR, Estimated: >60 mL/min (ref >60)
Glucose, Bld: 145 mg/dL — ABNORMAL HIGH (ref 70–99)
Potassium: 3.7 mmol/L (ref 3.5–5.1)
Sodium: 137 mmol/L (ref 135–145)
Total Bilirubin: 1.6 mg/dL — ABNORMAL HIGH (ref 0.3–1.2)
Total Protein: 7.5 g/dL (ref 6.5–8.1)

## 2021-09-20 LAB — URINALYSIS, ROUTINE W REFLEX MICROSCOPIC
Bilirubin Urine: NEGATIVE
Glucose, UA: NEGATIVE mg/dL
Hgb urine dipstick: NEGATIVE
Ketones, ur: 15 mg/dL — AB
Leukocytes,Ua: NEGATIVE
Nitrite: NEGATIVE
Specific Gravity, Urine: 1.015 (ref 1.005–1.030)
pH: 8.5 — ABNORMAL HIGH (ref 5.0–8.0)

## 2021-09-20 LAB — LIPASE, BLOOD: Lipase: 10 U/L — ABNORMAL LOW (ref 11–51)

## 2021-09-20 MED ORDER — TAMSULOSIN HCL 0.4 MG PO CAPS
0.4000 mg | ORAL_CAPSULE | Freq: Every day | ORAL | Status: DC
  Administered 2021-09-20: 0.4 mg via ORAL
  Filled 2021-09-20: qty 1

## 2021-09-20 MED ORDER — IOHEXOL 300 MG/ML  SOLN
100.0000 mL | Freq: Once | INTRAMUSCULAR | Status: AC | PRN
  Administered 2021-09-20: 75 mL via INTRAVENOUS

## 2021-09-20 MED ORDER — KETOROLAC TROMETHAMINE 15 MG/ML IJ SOLN
15.0000 mg | Freq: Once | INTRAMUSCULAR | Status: AC
  Administered 2021-09-20: 15 mg via INTRAVENOUS
  Filled 2021-09-20: qty 1

## 2021-09-20 MED ORDER — MORPHINE SULFATE (PF) 4 MG/ML IV SOLN
4.0000 mg | Freq: Once | INTRAVENOUS | Status: AC
  Administered 2021-09-20: 4 mg via INTRAVENOUS
  Filled 2021-09-20: qty 1

## 2021-09-20 MED ORDER — OXYCODONE HCL 5 MG PO TABS: 5.0000 mg | ORAL_TABLET | ORAL | 0 refills | Status: AC | PRN

## 2021-09-20 MED ORDER — TAMSULOSIN HCL 0.4 MG PO CAPS: 0.4000 mg | ORAL_CAPSULE | Freq: Every day | ORAL | 0 refills | Status: DC

## 2021-09-20 NOTE — ED Notes (Signed)
Discharge paperwork given and verbally understood. 

## 2021-09-20 NOTE — Discharge Instructions (Signed)
You were seen today for abdominal pain.  Given your level of pain you should go to the ER for evaluation today.

## 2021-09-20 NOTE — ED Notes (Signed)
Pt request pain meds. Provider aware.

## 2021-09-20 NOTE — ED Provider Notes (Signed)
[facility] EMERGENCY DEPT Provider Note   CSN: [id number] Arrival date & time: 09/20/21  1401     History Chief Complaint  Patient presents with   Abdominal Pain    HPI Jennifer Herrera is a 66 y.o. female presenting for acute on chronic abdominal pain.  Patient is a 66 year old female with a exquisitely comorbid medical history.  She has a history of IBS, redundant colon, frequent admissions for abdominal pain, difficult to manage constipation that is required multiple therapeutic interventions with gastroenterology.  She states that she has had approximately 12 hours of left lower quadrant abdominal pain that peaked 4 hours prior to arrival.  She states that during her appointment at work today she had a severe episode in the left flank.  She denies fevers or chills nausea or vomiting, syncope shortness of breath.  She states that her symptoms have grossly symptomatically resolved at this time but she still has mild discomfort towards her left lower back. No history of kidney stones..   Patient's recorded medical, surgical, social, medication list and allergies were reviewed in the Snapshot window as part of the initial history.   Review of Systems   Review of Systems  Constitutional:  Negative for chills and fever.  HENT:  Negative for ear pain and sore throat.   Eyes:  Negative for pain and visual disturbance.  Respiratory:  Negative for cough and shortness of breath.   Cardiovascular:  Negative for chest pain and palpitations.  Gastrointestinal:  Positive for abdominal pain and nausea. Negative for vomiting.  Genitourinary:  Negative for dysuria and hematuria.  Musculoskeletal:  Negative for arthralgias and back pain.  Skin:  Negative for color change and rash.  Neurological:  Negative for seizures and syncope.  All other systems reviewed and are negative.   Physical Exam Updated Vital Signs BP 123/77 (BP Location: Right Arm)   Pulse 88   Temp (!) 97.5 F  (36.4 C) (Oral)   Resp 16   Ht 5\' 6"  (1.676 m)   Wt 60.1 kg   SpO2 100%   BMI 21.39 kg/m  Physical Exam Vitals and nursing note reviewed.  Constitutional:      General: She is not in acute distress.    Appearance: She is well-developed.  HENT:     Head: Normocephalic and atraumatic.  Eyes:     Conjunctiva/sclera: Conjunctivae normal.  Cardiovascular:     Rate and Rhythm: Normal rate and regular rhythm.     Heart sounds: No murmur heard. Pulmonary:     Effort: Pulmonary effort is normal. No respiratory distress.     Breath sounds: Normal breath sounds.  Abdominal:     General: There is no distension.     Palpations: Abdomen is soft.     Tenderness: There is abdominal tenderness in the periumbilical area. There is no right CVA tenderness, left CVA tenderness or guarding.  Musculoskeletal:        General: No swelling or tenderness. Normal range of motion.     Cervical back: Neck supple.  Skin:    General: Skin is warm and dry.  Neurological:     General: No focal deficit present.     Mental Status: She is alert and oriented to person, place, and time. Mental status is at baseline.     Cranial Nerves: No cranial nerve deficit.      ED Course/ Medical Decision Making/ A&P    Procedures Procedures   Medications Ordered in ED Medications  tamsulosin (  FLOMAX) capsule 0.4 mg (0.4 mg Oral Given 09/20/21 1803)  iohexol (OMNIPAQUE) 300 MG/ML solution 100 mL (75 mLs Intravenous Contrast Given 09/20/21 1642)  morphine (PF) 4 MG/ML injection 4 mg (4 mg Intravenous Given 09/20/21 1724)  ketorolac (TORADOL) 15 MG/ML injection 15 mg (15 mg Intravenous Given 09/20/21 1724)   Medical Decision Making:    Jennifer Herrera is a 66 y.o. female who presented to the ED today with abdominal pain, detailed above.    Patient's presentation is complicated by their history of multiple comorbid medical problems.  Patient placed on continuous vitals and telemetry monitoring while in ED which was  reviewed periodically.  Complete initial physical exam performed, notably the patient  was hemodynamically stable in no acute distress.     Reviewed and confirmed nursing documentation for past medical history, family history, social history.    Initial Assessment:   With the patient's presentation of abdominal pain, most likely diagnosis is nonspecific etiology. Other diagnoses were considered including (but not limited to) gastroenteritis, colitis, small bowel obstruction, appendicitis, cholecystitis, pancreatitis, nephrolithiasis, UTI, pyleonephritis, ruptured ectopic pregnancy, PID, ovarian torsion. These are considered less likely due to history of present illness and physical exam findings.   This is most consistent with an acute life/limb threatening illness complicated by underlying chronic conditions.   Initial Plan:   CBC/CMP to evaluate for underlying infectious/metabolic etiology for patient's abdominal pain  Lipase to evaluate for pancreatitis  EKG to evaluate for cardiac source of pain  CTAB/Pelvis with contrast to evaluate for structural/surgical etiology of patients' severe abdominal pain.  Urinalysis and repeat physical assessment to evaluate for UTI/Pyelonpehritis  Empiric management of symptoms with escalating pain control and antiemetics as needed.   Initial Study Results:    Laboratory  All laboratory results reviewed without evidence of clinically relevant pathology.   Radiology All images reviewed independently. Agree with radiology report at this time.    CT ABDOMEN PELVIS W CONTRAST  Final Result         Final Reassessment and Plan:    Patient's objective evaluation resulted with 0.5 cm nephrolithiasis. Patient is ambulatory tolerating p.o. intake with well-controlled pain at this time.  No evidence of infection no evidence of intractable nausea or vomiting.  I recommended close outpatient follow-up with urology and referral was made.  Patient will be  prescribed pain medication, antiemetics and Flomax with plan for close outpatient follow-up.  Patient discharged with no further acute events.     Clinical Impression:  1. Nephrolithiasis      Discharge   Final Clinical Impression(s) / ED Diagnoses Final diagnoses:  Nephrolithiasis    Rx / DC Orders ED Discharge Orders          Ordered    oxyCODONE (ROXICODONE) 5 MG immediate release tablet  Every 4 hours PRN        09/20/21 1754    Ambulatory referral to Urology        09/20/21 1754    tamsulosin (FLOMAX) 0.4 MG CAPS capsule  Daily        09/20/21 2028              Glyn Ade, MD 09/20/21 2328

## 2021-09-20 NOTE — ED Provider Notes (Signed)
[facility]    CSN: [id number] Arrival date & time: 09/20/21  1241      History   Chief Complaint Chief Complaint  Patient presents with   Abdominal Pain    HPI Jennifer Herrera is a 66 y.o. female.   Patient is here for abd pain.  She does have a h/o IBS and started new medication this weekend.  She had BM on Saturday which was normal.  Today she had a small BM, and then started with LLQ abd pain, which is worsening..  currently is 9/10 in pain.  She is having pain around the lower abdomen/bladder/vaginal area.   She has had decreased appetite today;  no n/v.  Some chills today, no fever.  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   Diet-controlled type 2 diabetes mellitus (HCC) 02/23/2021   Hypothyroidism 11/20/2018   Vitamin D deficiency disease 11/20/2018   Hyperlipidemia associated with type 2 diabetes mellitus (HCC) 11/20/2018   Chronic constipation 04/16/2014   Redundant colon 04/16/2014    Past Surgical History:  Procedure Laterality Date   ANAL RECTAL MANOMETRY N/A 08/15/2019   Procedure: ANO RECTAL MANOMETRY;  Surgeon: Charlott Rakes, MD;  Location: [facility];  Service: Endoscopy;  Laterality: N/A;   AUGMENTATION MAMMAPLASTY Bilateral 1979   saline   BREAST ENHANCEMENT SURGERY     FACIAL COSMETIC SURGERY     SHOULDER ARTHROSCOPY     TONSILLECTOMY      OB History   No obstetric history on file.      Home Medications    Prior to Admission medications   Medication Sig Start Date End Date Taking? Authorizing Provider   Cholecalciferol (VITAMIN D-3) 125 MCG (5000 UT) TABS Take 1 tablet by mouth daily.    [provider]  Dulaglutide (TRULICITY) 1.5 MG/0.5ML SOPN Inject  1.5 mg  into Skin every 7 days  f  or Diabetes  ( Dx: e11.29) 09/06/21   Lucky Cowboy, MD  levothyroxine (SYNTHROID) 88 MCG tablet Take  1 tablet  Daily except take 1/2 tab on Mon/Thurs. Take on an empty stomach with only water for 30 minutes & no Antacid meds, Calcium or Magnesium for 4 hours & avoid Biotin 06/03/21   Judd Gaudier, NP  ondansetron (ZOFRAN) 8 MG tablet Take 1/2 to 1 tablet 3 x /day as needed for Nausea 05/06/21   Lucky Cowboy, MD  phentermine (ADIPEX-P) 37.5 MG tablet Take 1/2 to 1 tablet every Morning for Dieting & Weight Loss 09/06/21   Lucky Cowboy, MD  rosuvastatin (CRESTOR) 10 MG tablet Take 1 tablet Daily for Cholesterol 07/28/21   Raynelle Dick, NP  topiramate (TOPAMAX) 50 MG tablet TAKE 1/2 TO 1 TABLET TWICE DAILY; AT SUPPERTIME AND BEDTIME FOR DIETING AND WEIGHT LOSS 05/31/21   Judd Gaudier, NP  valACYclovir (VALTREX) 500 MG tablet Take 500 mg by mouth daily. Take every other day.    [provider]    Family History History reviewed. No pertinent family history.  Social History Social History   Tobacco Use   Smoking status: Never   Smokeless tobacco: Never  Vaping Use   Vaping Use: Never used  Substance Use Topics   Alcohol use: Yes    Alcohol/week: 2.0 standard drinks of alcohol    Types: 2 Glasses of wine per week    Comment: once weekly   Drug use: No     Allergies   Sulfa antibiotics and Sulfasalazine   Review of Systems Review of Systems   Constitutional: Negative.   HENT: Negative.    Respiratory: Negative.    Cardiovascular: Negative.   Gastrointestinal:  Positive for abdominal pain and nausea. Negative for vomiting.  Genitourinary: Negative.      Physical Exam Triage Vital Signs ED Triage Vitals  Enc Vitals Group     BP 09/20/21 1313 110/71     Pulse Rate 09/20/21 1313 63     Resp 09/20/21 1313 16     Temp 09/20/21 1313 97.6 F (36.4 C)     Temp src --      SpO2 09/20/21 1313 99 %     Weight --      Height --      Head Circumference --      Peak Flow --      Pain Score 09/20/21 1314 9     Pain Loc --      Pain Edu? --      Excl. in GC? --    No data found.  Updated Vital Signs BP 110/71 (BP Location: Left Arm)   Pulse 63   Temp 97.6 F (36.4 C)   Resp 16   SpO2 99%   Visual Acuity Right Eye Distance:   Left Eye Distance:   Bilateral Distance:    Right Eye Near:   Left Eye Near:    Bilateral Near:     Physical Exam Constitutional:      General: She is in acute distress.     Appearance: She is ill-appearing.     Comments: Leaning over in the chair doubled in pain  Cardiovascular:     Rate and Rhythm: Normal rate and regular rhythm.  Pulmonary:     Effort: Pulmonary effort is normal.     Breath sounds: Normal breath sounds.  Abdominal:     General: Bowel sounds are decreased.     Palpations: Abdomen is soft.  Tenderness: There is abdominal tenderness in the suprapubic area and left lower quadrant. There is no guarding or rebound.  Skin:    General: Skin is warm.  Neurological:     General: No focal deficit present.     Mental Status: She is alert.  Psychiatric:        Mood and Affect: Mood normal.      UC Treatments / Results  Labs (all labs ordered are listed, but only abnormal results are displayed) Labs Reviewed - No data to display  EKG   Radiology No results found.  Procedures Procedures (including critical care time)  Medications Ordered in  UC Medications - No data to display  Initial Impression / Assessment and Plan / UC Course  I have reviewed the triage vital signs and the nursing notes.  Pertinent labs & imaging results that were available during my care of the patient were reviewed by me and considered in my medical decision making (see chart for details).   Final Clinical Impressions(s) / UC Diagnoses   Final diagnoses:  Left lower quadrant abdominal pain     Discharge Instructions      You were seen today for abdominal pain.  Given your level of pain you should go to the ER for evaluation today.    ED Prescriptions   None    PDMP not reviewed this encounter.   Rondel Oh, MD 09/20/21 1345

## 2021-09-20 NOTE — ED Triage Notes (Signed)
Pt states LLQ abd pain since 10am also states she is having pressure in her bladder and vaginal area. States history of IBS no BM for the past 3 days.

## 2021-09-20 NOTE — ED Triage Notes (Signed)
Patient here POV from UC.  Endorses LLQ Pain that began this AM at 1000. Worsened since it began. Associated with Pain to Bladder and Vagina, as well as Lower Back.  History of IBS; Recently Constipated. No N/V/D.   NAD Noted during Triage. A&Ox4. GCS 15. BIB Wheelchair.

## 2021-09-20 NOTE — ED Notes (Signed)
Patient is being discharged from the Urgent Care and sent to the Emergency Department via personal vehicle. Per Dr.Piontek, patient is in need of higher level of care due to Abdominal pain. Patient is aware and verbalizes understanding of plan of care.  Vitals:   09/20/21 1313  BP: 110/71  Pulse: 63  Resp: 16  Temp: 97.6 F (36.4 C)  SpO2: 99%

## 2021-09-20 NOTE — Discharge Instructions (Addendum)
You were seen today for abdominal pain.  You were found to have a left-sided kidney stone.  We will prescribe you pain medication and Flomax to assist with passing this kidney stone.  First use Tylenol and Motrin to control your pain, use your pain medication if this medications are not sufficient. I have referred you to a urologist, you may use the phone number attached to schedule with any [facility] health urology department. If you begin to develop fevers and are worried about a infection, you should come back to emergency department. If your pain is out of control you should return for further care and management. Thank for the opportunity to participate in your care, Glyn Ade, MD.

## 2021-09-22 ENCOUNTER — Encounter: Payer: Self-pay | Admitting: Internal Medicine

## 2021-09-26 ENCOUNTER — Encounter: Payer: Self-pay | Admitting: Internal Medicine

## 2021-09-27 ENCOUNTER — Other Ambulatory Visit: Payer: Self-pay | Admitting: Nurse Practitioner

## 2021-09-27 DIAGNOSIS — N3090 Cystitis, unspecified without hematuria: Secondary | ICD-10-CM

## 2021-09-27 MED ORDER — CIPROFLOXACIN HCL 500 MG PO TABS: 500.0000 mg | ORAL_TABLET | Freq: Two times a day (BID) | ORAL | 0 refills | Status: AC

## 2021-10-05 DIAGNOSIS — M25512 Pain in left shoulder: Secondary | ICD-10-CM | POA: Diagnosis not present

## 2021-10-06 DIAGNOSIS — N132 Hydronephrosis with renal and ureteral calculous obstruction: Secondary | ICD-10-CM | POA: Diagnosis not present

## 2021-11-09 ENCOUNTER — Ambulatory Visit (INDEPENDENT_AMBULATORY_CARE_PROVIDER_SITE_OTHER): Payer: BC Managed Care – PPO

## 2021-11-09 VITALS — BP 118/70 | HR 68 | Temp 97.9°F | Resp 16 | Ht 66.0 in | Wt 131.4 lb

## 2021-11-09 DIAGNOSIS — E039 Hypothyroidism, unspecified: Secondary | ICD-10-CM | POA: Diagnosis not present

## 2021-11-09 NOTE — Progress Notes (Signed)
The patient came in for recheck of TSH. She reports she had COVID and a kidney stone and feels a little run down today. Other than those main complaints she voices no other concerns at this nurse visit. She reports she is taking her medications correctly at this time.

## 2021-11-10 ENCOUNTER — Encounter: Payer: Self-pay | Admitting: Internal Medicine

## 2021-11-10 LAB — TSH: TSH: 7.14 mIU/L — ABNORMAL HIGH (ref 0.40–4.50)

## 2021-11-12 ENCOUNTER — Other Ambulatory Visit: Payer: Self-pay | Admitting: Internal Medicine

## 2021-11-13 ENCOUNTER — Other Ambulatory Visit: Payer: Self-pay | Admitting: Internal Medicine

## 2021-11-13 DIAGNOSIS — E039 Hypothyroidism, unspecified: Secondary | ICD-10-CM

## 2021-11-13 NOTE — Progress Notes (Signed)
<><><><><><><><><><><><><><><><><><><><><><><><><><><><><><><><><> <><><><><><><><><><><><><><><><><><><><><><><><><><><><><><><><><>  -   Referral submitted per your request  <><><><><><><><><><><><><><><><><><><><><><><><><><><><><><><><><> <><><><><><><><><><><><><><><><><><><><><><><><><><><><><><><><><>

## 2021-11-14 ENCOUNTER — Encounter: Payer: Self-pay | Admitting: Internal Medicine

## 2021-11-21 ENCOUNTER — Encounter: Payer: Self-pay | Admitting: Internal Medicine

## 2021-11-29 DIAGNOSIS — E039 Hypothyroidism, unspecified: Secondary | ICD-10-CM | POA: Diagnosis not present

## 2021-11-29 DIAGNOSIS — Z23 Encounter for immunization: Secondary | ICD-10-CM | POA: Diagnosis not present

## 2021-11-29 DIAGNOSIS — R300 Dysuria: Secondary | ICD-10-CM | POA: Diagnosis not present

## 2021-12-08 ENCOUNTER — Encounter: Payer: BC Managed Care – PPO | Admitting: Internal Medicine

## 2022-01-09 ENCOUNTER — Encounter: Payer: Self-pay | Admitting: Internal Medicine

## 2022-01-09 NOTE — Progress Notes (Unsigned)
Annual Screening/Preventative Visit &  Comprehensive Evaluation &  Examination       This very nice 66 y.o. MWF  presents for a Screening /Preventative Visit & comprehensive evaluation and management of multiple medical co-morbidities.  Patient has been followed for HTN, HLD, T2_NIDDM   and Vitamin D Deficiency.        Patient is followed with labile HTN . Patient's BP has been controlled at home and patient denies any cardiac symptoms as chest pain, palpitations, shortness of breath, dizziness or ankle swelling. Today's          Patient's hyperlipidemia is controlled with diet and medications. Patient denies myalgias or other medication SE's. Last lipids were  at goal :  Lab Results  Component Value Date   CHOL 112 09/06/2021   HDL 53 09/06/2021   LDLCALC 45 09/06/2021   TRIG 56 09/06/2021   CHOLHDL 2.1 09/06/2021         Patient has hx/o T2_NIDDM. Patient denies reactive hypoglycemic symptoms, visual blurring, diabetic polys or paresthesias. Last A1c was not at goal :  Lab Results  Component Value Date   HGBA1C 5.8 (H) 09/06/2021                                                     Patient had been on desiccated thyroid for dx/o hypothyroidism circa 2010 by Dr Jearld Pies (now retired) for also helping with weight loss.  In Oct 2023 , patient requested referral to Dr Chalmers Cater for her Thyroid.         Finally, patient has history of Vitamin D Deficiency and last Vitamin D was at goal :  Lab Results  Component Value Date   VD25OH 51 09/06/2021     Current Outpatient Medications on File Prior to Visit  Medication Sig   Cholecalciferol (VITAMIN D-3) 125 MCG (5000 UT) TABS Take 1 tablet by mouth daily.   Dulaglutide (TRULICITY) 1.5 BL/3.9QZ SOPN Inject  1.5 mg  into Skin every 7 days  for Diabetes   levothyroxine 88 MCG tablet Take  1 tablet  Daily except take 1/2 tab on Mon/Thurs.    ondansetron (ZOFRAN) 8 MG tablet Take 1/2 to 1 tablet 3 x /day as needed for Nausea    phentermine  37.5 MG tablet Take 1/2 to 1 tablet every Morning for Dieting & Weight Loss   rosuvastatin (CRESTOR) 10 MG tablet Take 1 tablet Daily for Cholesterol   tamsulosin (FLOMAX) 0.4 MG CAPS capsule Take 1 capsule (0.4 mg total)    daily.   topiramate (50 MG tablet TAKE 1/2 TO 1 TABLET TWICE DAILY    valACYclovir  500 MG tablet Take 500 mg daily. Take every other day.      Allergies  Allergen Reactions   Sulfa Antibiotics Rash   Sulfasalazine Rash     Past Medical History:  Diagnosis Date   HLD (hyperlipidemia) 11/20/2018   Hypothyroidism, adult 11/20/2018   IBS (irritable bowel syndrome)    Prediabetes 11/20/2018   Thyroid disease    Vitamin D deficiency disease 11/20/2018     Health Maintenance  Topic Date Due   Medicare Annual Wellness (AWV)  Never done   OPHTHALMOLOGY EXAM  Never done   Diabetic kidney evaluation - Urine ACR  Never done   Zoster Vaccines- Shingrix (1 of 2)  Never done   INFLUENZA VACCINE  08/23/2021   COVID-19 Vaccine (4 - 2023-24 season) 09/23/2021   MAMMOGRAM  11/25/2021   HEMOGLOBIN A1C  03/09/2022   FOOT EXAM  06/03/2022   Diabetic kidney evaluation - eGFR measurement  09/21/2022   COLONOSCOPY (Pts 45-69yr Insurance coverage will need to be confirmed)  11/19/2024   DTaP/Tdap/Td (2 - Td or Tdap) 11/19/2028   Pneumonia Vaccine 66 Years old  Completed   DEXA SCAN  Completed   Hepatitis C Screening  Completed   HPV VACCINES  Aged Out     Immunization History  Administered Date(s) Administered   Influenza, High Dose Seasonal PF 12/07/2020   PFIZER(Purple Top)SARS-COV-2 Vaccination 01/30/2019, 02/22/2019, 09/30/2019   PNEUMOCOCCAL CONJUGATE-20 06/02/2021   Tdap 11/20/2018     Last Colon -    Last MGM -    Past Surgical History:  Procedure Laterality Date   ANAL RECTAL MANOMETRY N/A 08/15/2019   Procedure: ANO RECTAL MANOMETRY;  Surgeon: SWilford Corner MD;  Location: [facility];  Service: Endoscopy;  Laterality: N/A;    AUGMENTATION MAMMAPLASTY Bilateral 1979   saline   BREAST ENHANCEMENT SURGERY     FACIAL COSMETIC SURGERY     SHOULDER ARTHROSCOPY     TONSILLECTOMY            Social History   Tobacco Use   Smoking status: Never   Smokeless tobacco: Never  Vaping Use   Vaping Use: Never used  Substance Use Topics   Alcohol use: Yes    Alcohol/week: 2.0 standard drinks of alcohol    Types: 2 Glasses of wine per week    Comment: once weekly   Drug use: No      ROS Constitutional: Denies fever, chills, weight loss/gain, headaches, insomnia,  night sweats, and change in appetite. Does c/o fatigue. Eyes: Denies redness, blurred vision, diplopia, discharge, itchy, watery eyes.  ENT: Denies discharge, congestion, post nasal drip, epistaxis, sore throat, earache, hearing loss, dental pain, Tinnitus, Vertigo, Sinus pain, snoring.  Cardio: Denies chest pain, palpitations, irregular heartbeat, syncope, dyspnea, diaphoresis, orthopnea, PND, claudication, edema Respiratory: denies cough, dyspnea, DOE, pleurisy, hoarseness, laryngitis, wheezing.  Gastrointestinal: Denies dysphagia, heartburn, reflux, water brash, pain, cramps, nausea, vomiting, bloating, diarrhea, constipation, hematemesis, melena, hematochezia, jaundice, hemorrhoids Genitourinary: Denies dysuria, frequency, urgency, nocturia, hesitancy, discharge, hematuria, flank pain Breast: Breast lumps, nipple discharge, bleeding.  Musculoskeletal: Denies arthralgia, myalgia, stiffness, Jt. Swelling, pain, limp, and strain/sprain. Denies falls. Skin: Denies puritis, rash, hives, warts, acne, eczema, changing in skin lesion Neuro: No weakness, tremor, incoordination, spasms, paresthesia, pain Psychiatric: Denies confusion, memory loss, sensory loss. Denies Depression. Endocrine: Denies change in weight, skin, hair change, nocturia, and paresthesia, diabetic polys, visual blurring, hyper / hypo glycemic episodes.  Heme/Lymph: No excessive bleeding,  bruising, enlarged lymph nodes.  Physical Exam  There were no vitals taken for this visit.  General Appearance: Well nourished, well groomed and in no apparent distress.  Eyes: PERRLA, EOMs, conjunctiva no swelling or erythema, normal fundi and vessels. Sinuses: No frontal/maxillary tenderness ENT/Mouth: EACs patent / TMs  nl. Nares clear without erythema, swelling, mucoid exudates. Oral hygiene is good. No erythema, swelling, or exudate. Tongue normal, non-obstructing. Tonsils not swollen or erythematous. Hearing normal.  Neck: Supple, thyroid not palpable. No bruits, nodes or JVD. Respiratory: Respiratory effort normal.  BS equal and clear bilateral without rales, rhonci, wheezing or stridor. Cardio: Heart sounds are normal with regular rate and rhythm and no murmurs, rubs or gallops. Peripheral pulses are normal  and equal bilaterally without edema. No aortic or femoral bruits. Chest: symmetric with normal excursions and percussion. Breasts: Symmetric, without lumps, nipple discharge, retractions, or fibrocystic changes.  Abdomen: Flat, soft with bowel sounds active. Nontender, no guarding, rebound, hernias, masses, or organomegaly.  Lymphatics: Non tender without lymphadenopathy.  Genitourinary:  Musculoskeletal: Full ROM all peripheral extremities, joint stability, 5/5 strength, and normal gait. Skin: Warm and dry without rashes, lesions, cyanosis, clubbing or  ecchymosis.  Neuro: Cranial nerves intact, reflexes equal bilaterally. Normal muscle tone, no cerebellar symptoms. Sensation intact.  Pysch: Alert and oriented X 3, normal affect, Insight and Judgment appropriate.    Assessment and Plan  1. Annual Preventative Screening Examination            Patient was counseled in prudent diet to achieve/maintain BMI less than 25 for weight control, BP monitoring, regular exercise and medications. Discussed med's effects and SE's. Screening labs and tests as requested with regular  follow-up as recommended. Over 40 minutes of exam, counseling, chart review and high complex critical decision making was performed.   Kirtland Bouchard, MD

## 2022-01-09 NOTE — Patient Instructions (Signed)

## 2022-01-10 ENCOUNTER — Encounter: Payer: Self-pay | Admitting: Internal Medicine

## 2022-01-10 ENCOUNTER — Ambulatory Visit (INDEPENDENT_AMBULATORY_CARE_PROVIDER_SITE_OTHER): Payer: Medicare Other | Admitting: Internal Medicine

## 2022-01-10 VITALS — BP 130/78 | HR 80 | Temp 97.9°F | Resp 16 | Ht 66.0 in | Wt 137.2 lb

## 2022-01-10 DIAGNOSIS — E559 Vitamin D deficiency, unspecified: Secondary | ICD-10-CM

## 2022-01-10 DIAGNOSIS — E782 Mixed hyperlipidemia: Secondary | ICD-10-CM | POA: Diagnosis not present

## 2022-01-10 DIAGNOSIS — Z79899 Other long term (current) drug therapy: Secondary | ICD-10-CM | POA: Diagnosis not present

## 2022-01-10 DIAGNOSIS — E039 Hypothyroidism, unspecified: Secondary | ICD-10-CM

## 2022-01-10 DIAGNOSIS — R030 Elevated blood-pressure reading, without diagnosis of hypertension: Secondary | ICD-10-CM

## 2022-01-10 DIAGNOSIS — E1169 Type 2 diabetes mellitus with other specified complication: Secondary | ICD-10-CM

## 2022-01-10 DIAGNOSIS — Z0000 Encounter for general adult medical examination without abnormal findings: Secondary | ICD-10-CM | POA: Diagnosis not present

## 2022-01-10 DIAGNOSIS — Z1211 Encounter for screening for malignant neoplasm of colon: Secondary | ICD-10-CM

## 2022-01-10 DIAGNOSIS — Z136 Encounter for screening for cardiovascular disorders: Secondary | ICD-10-CM

## 2022-01-10 DIAGNOSIS — Z0001 Encounter for general adult medical examination with abnormal findings: Secondary | ICD-10-CM

## 2022-01-10 DIAGNOSIS — E1122 Type 2 diabetes mellitus with diabetic chronic kidney disease: Secondary | ICD-10-CM

## 2022-01-10 DIAGNOSIS — E785 Hyperlipidemia, unspecified: Secondary | ICD-10-CM | POA: Diagnosis not present

## 2022-01-11 ENCOUNTER — Other Ambulatory Visit: Payer: Self-pay | Admitting: Internal Medicine

## 2022-01-11 DIAGNOSIS — E1122 Type 2 diabetes mellitus with diabetic chronic kidney disease: Secondary | ICD-10-CM

## 2022-01-11 DIAGNOSIS — N179 Acute kidney failure, unspecified: Secondary | ICD-10-CM

## 2022-01-11 LAB — COMPLETE METABOLIC PANEL WITH GFR
AG Ratio: 1.3 (calc) (ref 1.0–2.5)
ALT: 11 U/L (ref 6–29)
AST: 15 U/L (ref 10–35)
Albumin: 4.1 g/dL (ref 3.6–5.1)
Alkaline phosphatase (APISO): 78 U/L (ref 37–153)
BUN/Creatinine Ratio: 14 (calc) (ref 6–22)
BUN: 19 mg/dL (ref 7–25)
CO2: 29 mmol/L (ref 20–32)
Calcium: 10.1 mg/dL (ref 8.6–10.4)
Chloride: 107 mmol/L (ref 98–110)
Creat: 1.39 mg/dL — ABNORMAL HIGH (ref 0.50–1.05)
Globulin: 3.1 g/dL (calc) (ref 1.9–3.7)
Glucose, Bld: 90 mg/dL (ref 65–99)
Potassium: 5.3 mmol/L (ref 3.5–5.3)
Sodium: 141 mmol/L (ref 135–146)
Total Bilirubin: 1 mg/dL (ref 0.2–1.2)
Total Protein: 7.2 g/dL (ref 6.1–8.1)
eGFR: 42 mL/min/{1.73_m2} — ABNORMAL LOW (ref >=60)

## 2022-01-11 LAB — URINALYSIS, ROUTINE W REFLEX MICROSCOPIC
Bilirubin Urine: NEGATIVE
Glucose, UA: NEGATIVE
Hgb urine dipstick: NEGATIVE
Ketones, ur: NEGATIVE
Leukocytes,Ua: NEGATIVE
Nitrite: NEGATIVE
Protein, ur: NEGATIVE
Specific Gravity, Urine: 1.017 (ref 1.001–1.035)
pH: 7 (ref 5.0–8.0)

## 2022-01-11 LAB — CBC WITH DIFFERENTIAL/PLATELET
Absolute Monocytes: 555 cells/uL (ref 200–950)
Basophils Absolute: 30 cells/uL (ref 0–200)
Basophils Relative: 0.4 %
Eosinophils Absolute: 89 cells/uL (ref 15–500)
Eosinophils Relative: 1.2 %
HCT: 39.8 % (ref 35.0–45.0)
Hemoglobin: 13.6 g/dL (ref 11.7–15.5)
Lymphs Abs: 3019 cells/uL (ref 850–3900)
MCH: 32 pg (ref 27.0–33.0)
MCHC: 34.2 g/dL (ref 32.0–36.0)
MCV: 93.6 fL (ref 80.0–100.0)
MPV: 11.6 fL (ref 7.5–12.5)
Monocytes Relative: 7.5 %
Neutro Abs: 3707 cells/uL (ref 1500–7800)
Neutrophils Relative %: 50.1 %
Platelets: 264 10*3/uL (ref 140–400)
RBC: 4.25 10*6/uL (ref 3.80–5.10)
RDW: 11.8 % (ref 11.0–15.0)
Total Lymphocyte: 40.8 %
WBC: 7.4 10*3/uL (ref 3.8–10.8)

## 2022-01-11 LAB — LIPID PANEL
Cholesterol: 132 mg/dL (ref <200)
HDL: 57 mg/dL (ref >=50)
LDL Cholesterol (Calc): 61 mg/dL (calc)
Non-HDL Cholesterol (Calc): 75 mg/dL (calc) (ref <130)
Total CHOL/HDL Ratio: 2.3 (calc) (ref <5.0)
Triglycerides: 67 mg/dL (ref <150)

## 2022-01-11 LAB — MAGNESIUM: Magnesium: 2.3 mg/dL (ref 1.5–2.5)

## 2022-01-11 LAB — VITAMIN D 25 HYDROXY (VIT D DEFICIENCY, FRACTURES): Vit D, 25-Hydroxy: 73 ng/mL (ref 30–100)

## 2022-01-11 LAB — INSULIN, RANDOM: Insulin: 10.9 u[IU]/mL

## 2022-01-11 LAB — MICROALBUMIN / CREATININE URINE RATIO
Creatinine, Urine: 94 mg/dL (ref 20–275)
Microalb, Ur: <0.2 mg/dL

## 2022-01-11 LAB — HEMOGLOBIN A1C
Hgb A1c MFr Bld: 5.8 % of total Hgb — ABNORMAL HIGH (ref <5.7)
Mean Plasma Glucose: 120 mg/dL
eAG (mmol/L): 6.6 mmol/L

## 2022-01-11 NOTE — Progress Notes (Signed)
<><><><><><><><><><><><><><><><><><><><><><><><><><><><><><><><><> <><><><><><><><><><><><><><><><><><><><><><><><><><><><><><><><><> - Test results slightly outside the reference range are not unusual. If there is anything important, I will review this with you,  otherwise it is considered normal test values.  If you have further questions,  please do not hesitate to contact me at the office or via My Chart.  <><><><><><><><><><><><><><><><><><><><><><><><><><><><><><><><><> <><><><><><><><><><><><><><><><><><><><><><><><><><><><><><><><><>  -  U/A -0 OK - No Infection <><><><><><><><><><><><><><><><><><><><><><><><><><><><><><><><><> <><><><><><><><><><><><><><><><><><><><><><><><><><><><><><><><><>  -  Creatinine is slightly increased & GFR is decreased    - So   Kidney functions  look a little dehydrated   - This usually occurs when an individual is not drinking enough fluids !    -  Very important to drink adequate amounts of fluids to prevent permanent damage     - Recommend drink at least 6 bottles (16 ounces) of fluids /water /day = 96 Oz ~100 oz  - 100 oz = 3,000 cc or 3 liters / day  - >> That's 1 &1/2 bottles of a 2 liter soda bottle /day !   - Suggest you call office to schedule a lab visit mid January to recheck a CMET to                                                                             assure kidney functions are back to normal  <><><><><><><><><><><><><><><><><><><><><><><><><><><><><><><><><> <><><><><><><><><><><><><><><><><><><><><><><><><><><><><><><><><>  -  A1c  = 5.8% - still borderline elevated sugar   - So . . . . . . . . . . Marland Kitchen   - Avoid Sweets, Candy & White Stuff   - White Rice, White [place], White Flour  - Breads &  Pasta <><><><><><><><><><><><><><><><><><><><><><><><><><><><><><><><><> <><><><><><><><><><><><><><><><><><><><><><><><><><><><><><><><><>  - Total Chol = 132   and LDL Chol  61  -     Both  Excellent   - Very low risk  for Heart Attack  / Stroke <><><><><><><><><><><><><><><><><><><><><><><><><><><><><><><><><> <><><><><><><><><><><><><><><><><><><><><><><><><><><><><><><><><>  - Vitamin D = 73  - Excellent   - Vitamin D goal is between 70-100.    - It is very important as a natural anti-inflammatory and helping the                                  immune system protect against viral infections, like the Covid-19    helping hair, skin, and nails, as well as reducing stroke and  heart attack risk.   - It helps your bones and helps with mood.  - It also decreases numerous cancer risks   - Low Vit D is associated with a 200-300% higher risk for CANCER                                        and 200-300% higher risk for HEART   ATTACK  &  STROKE.    - It is also associated with higher death rate at younger ages,   autoimmune diseases like Rheumatoid arthritis, Lupus,  Multiple Sclerosis.     - Also many other serious conditions, like depression, Alzheimer's  Dementia, infertility, muscle aches, fatigue, fibromyalgia  <><><><><><><><><><><><><><><><><><><><><><><><><><><><><><><><><> <><><><><><><><><><><><><><><><><><><><><><><><><><><><><><><><><>  - All Else - CBC - Kidneys - Electrolytes - Liver - Magnesium & Thyroid    - all  Normal / OK <><><><><><><><><><><><><><><><><><><><><><><><><><><><><><><><><> <><><><><><><><><><><><><><><><><><><><><><><><><><><><><><><><><>  - Keep up the Great Work ! <><><><><><><><><><><><><><><><><><><><><><><><><><><><><><><><><> <><><><><><><><><><><><><><><><><><><><><><><><><><><><><><><><><>

## 2022-01-25 ENCOUNTER — Encounter: Payer: Self-pay | Admitting: Internal Medicine

## 2022-01-30 ENCOUNTER — Other Ambulatory Visit: Payer: Self-pay | Admitting: Internal Medicine

## 2022-01-30 DIAGNOSIS — E1122 Type 2 diabetes mellitus with diabetic chronic kidney disease: Secondary | ICD-10-CM

## 2022-01-30 MED ORDER — TRULICITY 1.5 MG/0.5ML ~~LOC~~ SOAJ: SUBCUTANEOUS | 0 refills | Status: DC

## 2022-02-01 ENCOUNTER — Other Ambulatory Visit: Payer: Self-pay | Admitting: Internal Medicine

## 2022-02-01 ENCOUNTER — Encounter: Payer: Self-pay | Admitting: Internal Medicine

## 2022-02-01 DIAGNOSIS — E1122 Type 2 diabetes mellitus with diabetic chronic kidney disease: Secondary | ICD-10-CM

## 2022-02-01 MED ORDER — TRULICITY 1.5 MG/0.5ML ~~LOC~~ SOAJ: SUBCUTANEOUS | 0 refills | Status: DC

## 2022-02-09 ENCOUNTER — Other Ambulatory Visit: Payer: Self-pay | Admitting: Internal Medicine

## 2022-02-09 DIAGNOSIS — E1122 Type 2 diabetes mellitus with diabetic chronic kidney disease: Secondary | ICD-10-CM

## 2022-02-09 MED ORDER — TRULICITY 0.75 MG/0.5ML ~~LOC~~ SOAJ: SUBCUTANEOUS | 0 refills | Status: DC

## 2022-03-06 DIAGNOSIS — M25512 Pain in left shoulder: Secondary | ICD-10-CM | POA: Diagnosis not present

## 2022-03-15 DIAGNOSIS — E039 Hypothyroidism, unspecified: Secondary | ICD-10-CM | POA: Diagnosis not present

## 2022-03-16 DIAGNOSIS — M25512 Pain in left shoulder: Secondary | ICD-10-CM | POA: Diagnosis not present

## 2022-03-17 ENCOUNTER — Other Ambulatory Visit: Payer: Self-pay | Admitting: Internal Medicine

## 2022-03-17 DIAGNOSIS — E1122 Type 2 diabetes mellitus with diabetic chronic kidney disease: Secondary | ICD-10-CM

## 2022-03-17 MED ORDER — TRULICITY 1.5 MG/0.5ML ~~LOC~~ SOAJ: SUBCUTANEOUS | 0 refills | Status: DC

## 2022-04-03 DIAGNOSIS — M7502 Adhesive capsulitis of left shoulder: Secondary | ICD-10-CM | POA: Diagnosis not present

## 2022-04-07 ENCOUNTER — Other Ambulatory Visit: Payer: Self-pay | Admitting: Internal Medicine

## 2022-04-07 ENCOUNTER — Encounter: Payer: Self-pay | Admitting: Internal Medicine

## 2022-04-07 DIAGNOSIS — E1122 Type 2 diabetes mellitus with diabetic chronic kidney disease: Secondary | ICD-10-CM

## 2022-04-07 MED ORDER — TRULICITY 3 MG/0.5ML ~~LOC~~ SOAJ: SUBCUTANEOUS | 3 refills | Status: DC

## 2022-04-19 DIAGNOSIS — M25512 Pain in left shoulder: Secondary | ICD-10-CM | POA: Diagnosis not present

## 2022-04-19 DIAGNOSIS — M25612 Stiffness of left shoulder, not elsewhere classified: Secondary | ICD-10-CM | POA: Diagnosis not present

## 2022-04-26 DIAGNOSIS — M25612 Stiffness of left shoulder, not elsewhere classified: Secondary | ICD-10-CM | POA: Diagnosis not present

## 2022-04-26 DIAGNOSIS — M25512 Pain in left shoulder: Secondary | ICD-10-CM | POA: Diagnosis not present

## 2022-04-28 DIAGNOSIS — M25512 Pain in left shoulder: Secondary | ICD-10-CM | POA: Diagnosis not present

## 2022-04-28 DIAGNOSIS — M25612 Stiffness of left shoulder, not elsewhere classified: Secondary | ICD-10-CM | POA: Diagnosis not present

## 2022-05-03 DIAGNOSIS — M25612 Stiffness of left shoulder, not elsewhere classified: Secondary | ICD-10-CM | POA: Diagnosis not present

## 2022-05-03 DIAGNOSIS — M25512 Pain in left shoulder: Secondary | ICD-10-CM | POA: Diagnosis not present

## 2022-05-05 DIAGNOSIS — M25612 Stiffness of left shoulder, not elsewhere classified: Secondary | ICD-10-CM | POA: Diagnosis not present

## 2022-05-05 DIAGNOSIS — M25512 Pain in left shoulder: Secondary | ICD-10-CM | POA: Diagnosis not present

## 2022-05-09 DIAGNOSIS — M25612 Stiffness of left shoulder, not elsewhere classified: Secondary | ICD-10-CM | POA: Diagnosis not present

## 2022-05-09 DIAGNOSIS — M25512 Pain in left shoulder: Secondary | ICD-10-CM | POA: Diagnosis not present

## 2022-05-11 DIAGNOSIS — M25612 Stiffness of left shoulder, not elsewhere classified: Secondary | ICD-10-CM | POA: Diagnosis not present

## 2022-05-11 DIAGNOSIS — M25512 Pain in left shoulder: Secondary | ICD-10-CM | POA: Diagnosis not present

## 2022-05-15 DIAGNOSIS — M7502 Adhesive capsulitis of left shoulder: Secondary | ICD-10-CM | POA: Diagnosis not present

## 2022-05-28 ENCOUNTER — Other Ambulatory Visit: Payer: Self-pay | Admitting: Internal Medicine

## 2022-05-28 ENCOUNTER — Encounter: Payer: Self-pay | Admitting: Internal Medicine

## 2022-05-28 MED ORDER — NITROFURANTOIN MONOHYD MACRO 100 MG PO CAPS: ORAL_CAPSULE | ORAL | 1 refills | Status: DC

## 2022-06-01 DIAGNOSIS — Z6822 Body mass index (BMI) 22.0-22.9, adult: Secondary | ICD-10-CM | POA: Diagnosis not present

## 2022-06-01 DIAGNOSIS — Z01419 Encounter for gynecological examination (general) (routine) without abnormal findings: Secondary | ICD-10-CM | POA: Diagnosis not present

## 2022-06-02 ENCOUNTER — Other Ambulatory Visit: Payer: Self-pay | Admitting: Internal Medicine

## 2022-06-02 DIAGNOSIS — E785 Hyperlipidemia, unspecified: Secondary | ICD-10-CM

## 2022-06-02 MED ORDER — ROSUVASTATIN CALCIUM 10 MG PO TABS: ORAL_TABLET | ORAL | 3 refills | Status: DC

## 2022-06-05 ENCOUNTER — Encounter: Payer: Self-pay | Admitting: Nurse Practitioner

## 2022-06-05 ENCOUNTER — Ambulatory Visit (INDEPENDENT_AMBULATORY_CARE_PROVIDER_SITE_OTHER): Payer: Medicare Other | Admitting: Nurse Practitioner

## 2022-06-05 VITALS — BP 96/60 | HR 80 | Temp 97.7°F | Ht 66.0 in | Wt 134.4 lb

## 2022-06-05 DIAGNOSIS — Z0000 Encounter for general adult medical examination without abnormal findings: Secondary | ICD-10-CM

## 2022-06-05 DIAGNOSIS — R6889 Other general symptoms and signs: Secondary | ICD-10-CM | POA: Diagnosis not present

## 2022-06-05 DIAGNOSIS — K5909 Other constipation: Secondary | ICD-10-CM

## 2022-06-05 DIAGNOSIS — H43812 Vitreous degeneration, left eye: Secondary | ICD-10-CM

## 2022-06-05 DIAGNOSIS — E119 Type 2 diabetes mellitus without complications: Secondary | ICD-10-CM | POA: Diagnosis not present

## 2022-06-05 DIAGNOSIS — E559 Vitamin D deficiency, unspecified: Secondary | ICD-10-CM

## 2022-06-05 DIAGNOSIS — Q438 Other specified congenital malformations of intestine: Secondary | ICD-10-CM | POA: Diagnosis not present

## 2022-06-05 DIAGNOSIS — E785 Hyperlipidemia, unspecified: Secondary | ICD-10-CM | POA: Diagnosis not present

## 2022-06-05 DIAGNOSIS — E039 Hypothyroidism, unspecified: Secondary | ICD-10-CM

## 2022-06-05 DIAGNOSIS — I4719 Other supraventricular tachycardia: Secondary | ICD-10-CM

## 2022-06-05 DIAGNOSIS — Z79899 Other long term (current) drug therapy: Secondary | ICD-10-CM

## 2022-06-05 DIAGNOSIS — Z0001 Encounter for general adult medical examination with abnormal findings: Secondary | ICD-10-CM | POA: Diagnosis not present

## 2022-06-05 DIAGNOSIS — E1169 Type 2 diabetes mellitus with other specified complication: Secondary | ICD-10-CM | POA: Diagnosis not present

## 2022-06-05 NOTE — Patient Instructions (Signed)
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
## 2022-06-06 ENCOUNTER — Encounter: Payer: Self-pay | Admitting: Nurse Practitioner

## 2022-06-06 LAB — COMPLETE METABOLIC PANEL WITH GFR
AG Ratio: 1.7 (calc) (ref 1.0–2.5)
ALT: 12 U/L (ref 6–29)
AST: 14 U/L (ref 10–35)
Albumin: 4.1 g/dL (ref 3.6–5.1)
Alkaline phosphatase (APISO): 70 U/L (ref 37–153)
BUN/Creatinine Ratio: 11 (calc) (ref 6–22)
BUN: 14 mg/dL (ref 7–25)
CO2: 26 mmol/L (ref 20–32)
Calcium: 9.5 mg/dL (ref 8.6–10.4)
Chloride: 105 mmol/L (ref 98–110)
Creat: 1.26 mg/dL — ABNORMAL HIGH (ref 0.50–1.05)
Globulin: 2.4 g/dL (calc) (ref 1.9–3.7)
Glucose, Bld: 90 mg/dL (ref 65–99)
Potassium: 4.6 mmol/L (ref 3.5–5.3)
Sodium: 138 mmol/L (ref 135–146)
Total Bilirubin: 1.2 mg/dL (ref 0.2–1.2)
Total Protein: 6.5 g/dL (ref 6.1–8.1)
eGFR: 47 mL/min/{1.73_m2} — ABNORMAL LOW (ref >=60)

## 2022-06-06 LAB — CBC WITH DIFFERENTIAL/PLATELET
Absolute Monocytes: 640 cells/uL (ref 200–950)
Basophils Absolute: 31 cells/uL (ref 0–200)
Basophils Relative: 0.4 %
Eosinophils Absolute: 70 cells/uL (ref 15–500)
Eosinophils Relative: 0.9 %
HCT: 38.7 % (ref 35.0–45.0)
Hemoglobin: 13.2 g/dL (ref 11.7–15.5)
Lymphs Abs: 2980 cells/uL (ref 850–3900)
MCH: 31.7 pg (ref 27.0–33.0)
MCHC: 34.1 g/dL (ref 32.0–36.0)
MCV: 93 fL (ref 80.0–100.0)
MPV: 11.3 fL (ref 7.5–12.5)
Monocytes Relative: 8.2 %
Neutro Abs: 4079 cells/uL (ref 1500–7800)
Neutrophils Relative %: 52.3 %
Platelets: 295 10*3/uL (ref 140–400)
RBC: 4.16 10*6/uL (ref 3.80–5.10)
RDW: 12.8 % (ref 11.0–15.0)
Total Lymphocyte: 38.2 %
WBC: 7.8 10*3/uL (ref 3.8–10.8)

## 2022-06-06 LAB — HEMOGLOBIN A1C
Hgb A1c MFr Bld: 5.5 % of total Hgb (ref <5.7)
Mean Plasma Glucose: 111 mg/dL
eAG (mmol/L): 6.2 mmol/L

## 2022-06-06 LAB — LIPID PANEL
Cholesterol: 186 mg/dL (ref <200)
HDL: 49 mg/dL — ABNORMAL LOW (ref >=50)
LDL Cholesterol (Calc): 112 mg/dL (calc) — ABNORMAL HIGH
Non-HDL Cholesterol (Calc): 137 mg/dL (calc) — ABNORMAL HIGH (ref <130)
Total CHOL/HDL Ratio: 3.8 (calc) (ref <5.0)
Triglycerides: 130 mg/dL (ref <150)

## 2022-06-06 LAB — VITAMIN D 25 HYDROXY (VIT D DEFICIENCY, FRACTURES): Vit D, 25-Hydroxy: 66 ng/mL (ref 30–100)

## 2022-06-14 ENCOUNTER — Other Ambulatory Visit: Payer: Self-pay | Admitting: Internal Medicine

## 2022-06-14 DIAGNOSIS — Z87440 Personal history of urinary (tract) infections: Secondary | ICD-10-CM

## 2022-06-14 MED ORDER — CEPHALEXIN 500 MG PO CAPS: ORAL_CAPSULE | ORAL | 0 refills | Status: DC

## 2022-06-15 DIAGNOSIS — K5900 Constipation, unspecified: Secondary | ICD-10-CM | POA: Diagnosis not present

## 2022-06-28 ENCOUNTER — Other Ambulatory Visit: Payer: Self-pay | Admitting: Internal Medicine

## 2022-06-28 ENCOUNTER — Other Ambulatory Visit: Payer: Medicare Other

## 2022-06-28 ENCOUNTER — Encounter: Payer: Self-pay | Admitting: Nurse Practitioner

## 2022-06-28 DIAGNOSIS — R300 Dysuria: Secondary | ICD-10-CM | POA: Diagnosis not present

## 2022-06-28 MED ORDER — NITROFURANTOIN MONOHYD MACRO 100 MG PO CAPS
ORAL_CAPSULE | ORAL | 0 refills | Status: DC
Start: 2022-06-28 — End: 2023-01-09

## 2022-06-29 LAB — URINALYSIS, ROUTINE W REFLEX MICROSCOPIC: Protein, ur: NEGATIVE

## 2022-07-01 ENCOUNTER — Other Ambulatory Visit: Payer: Self-pay | Admitting: Internal Medicine

## 2022-07-01 DIAGNOSIS — Z87440 Personal history of urinary (tract) infections: Secondary | ICD-10-CM

## 2022-07-01 LAB — URINE CULTURE
MICRO NUMBER:: 15046411
SPECIMEN QUALITY:: ADEQUATE

## 2022-07-01 LAB — URINALYSIS, ROUTINE W REFLEX MICROSCOPIC
Bilirubin Urine: NEGATIVE
Glucose, UA: NEGATIVE
Hgb urine dipstick: NEGATIVE
Ketones, ur: NEGATIVE
Nitrite: NEGATIVE
RBC / HPF: NONE SEEN /HPF (ref 0–2)
Specific Gravity, Urine: 1.012 (ref 1.001–1.035)
Squamous Epithelial / HPF: NONE SEEN /HPF (ref <=5)
pH: 7.5 (ref 5.0–8.0)

## 2022-07-01 LAB — MICROSCOPIC MESSAGE

## 2022-07-01 NOTE — Progress Notes (Signed)
^<^<^<^<^<^<^<^<^<^<^<^<^<^<^<^<^<^<^<^<^<^<^<^<^<^<^<^<^<^<^<^<^<^<^<^<^ ^>^>^>^>^>^>^>^>^>^>^>>^>^>^>^>^>^>^>^>^>^>^>^>^>^>^>^>^>^>^>^>^>^>^>^>^>  -   Urine culture returned (+) positive for UTI &                                         should respond well to the Prescribed Macrobid.  - Please call office to schedule a Nurse Visit to recheck U/A  & C/S in 1 month  ^<^<^<^<^<^<^<^<^<^<^<^<^<^<^<^<^<^<^<^<^<^<^<^<^<^<^<^<^<^<^<^<^<^<^<^<^ ^>^>^>^>^>^>^>^>^>^>^>^>^>^>^>^>^>^>^>^>^>^>^>^>^>^>^>^>^>^>^>^>^>^>^>^>^  -

## 2022-08-24 DIAGNOSIS — M8588 Other specified disorders of bone density and structure, other site: Secondary | ICD-10-CM | POA: Diagnosis not present

## 2022-08-24 DIAGNOSIS — Z1231 Encounter for screening mammogram for malignant neoplasm of breast: Secondary | ICD-10-CM | POA: Diagnosis not present

## 2022-08-24 LAB — HM MAMMOGRAPHY

## 2022-08-24 LAB — HM DEXA SCAN

## 2022-09-13 DIAGNOSIS — E039 Hypothyroidism, unspecified: Secondary | ICD-10-CM | POA: Diagnosis not present

## 2022-09-20 DIAGNOSIS — E119 Type 2 diabetes mellitus without complications: Secondary | ICD-10-CM | POA: Diagnosis not present

## 2022-09-20 DIAGNOSIS — E039 Hypothyroidism, unspecified: Secondary | ICD-10-CM | POA: Diagnosis not present

## 2022-09-20 DIAGNOSIS — M8580 Other specified disorders of bone density and structure, unspecified site: Secondary | ICD-10-CM | POA: Diagnosis not present

## 2022-10-05 DIAGNOSIS — R202 Paresthesia of skin: Secondary | ICD-10-CM | POA: Diagnosis not present

## 2022-11-21 LAB — HM DIABETES EYE EXAM

## 2022-12-13 ENCOUNTER — Encounter: Payer: Self-pay | Admitting: Internal Medicine

## 2023-01-03 ENCOUNTER — Encounter: Payer: Self-pay | Admitting: Internal Medicine

## 2023-01-03 ENCOUNTER — Ambulatory Visit (INDEPENDENT_AMBULATORY_CARE_PROVIDER_SITE_OTHER): Payer: Medicare Other | Admitting: Nurse Practitioner

## 2023-01-03 ENCOUNTER — Encounter: Payer: Self-pay | Admitting: Nurse Practitioner

## 2023-01-03 ENCOUNTER — Other Ambulatory Visit: Payer: Self-pay | Admitting: Internal Medicine

## 2023-01-03 ENCOUNTER — Ambulatory Visit: Payer: Medicare Other

## 2023-01-03 VITALS — BP 110/68 | HR 73 | Temp 97.7°F | Ht 66.0 in | Wt 140.6 lb

## 2023-01-03 DIAGNOSIS — N3000 Acute cystitis without hematuria: Secondary | ICD-10-CM

## 2023-01-03 DIAGNOSIS — R300 Dysuria: Secondary | ICD-10-CM | POA: Diagnosis not present

## 2023-01-03 MED ORDER — NITROFURANTOIN MONOHYD MACRO 100 MG PO CAPS
100.0000 mg | ORAL_CAPSULE | Freq: Two times a day (BID) | ORAL | 0 refills | Status: DC
Start: 2023-01-03 — End: 2023-01-09

## 2023-01-03 NOTE — Progress Notes (Signed)
Assessment and Plan:  Tristen Bermea was seen today for an episodic visit.  Diagnoses and all order for this visit:  Dysuria/Acute cystitis without hematuria Stay well hydrated to keep urinary system well flushed Consider daily cranberry juice or oral supplement to help any bacteria from adhering to bladder wall causing increase for infection. Monitor for any increase in fever, chills, N/V, abdominal pain, hematuria.   Contact office or report to ER for further evaluation if s/s fail to improve or any sign of worsening infection as noted above.  - nitrofurantoin, macrocrystal-monohydrate, (MACROBID) 100 MG capsule; Take 1 capsule (100 mg total) by mouth 2 (two) times daily for 7 days.  Dispense: 14 capsule; Refill: 0 - Urinalysis, Routine w reflex microscopic - Urine Culture  Notify office for further evaluation and treatment, questions or concerns if s/s fail to improve. The risks and benefits of my recommendations, as well as other treatment options were discussed with the patient today. Questions were answered.  Further disposition pending results of labs. Discussed med's effects and SE's.    Over 20 minutes of exam, counseling, chart review, and critical decision making was performed.   Future Appointments  Date Time Provider Department Center  01/30/2023  3:00 PM Lucky Cowboy, MD [facility] None  06/05/2023  4:00 PM Joaquim Tolen, Archie Patten, NP [facility] None    ------------------------------------------------------------------------------------------------------------------   HPI BP 110/68   Pulse 73   Temp 97.7 F (36.5 C)   Ht 5\' 6"  (1.676 m)   Wt 140 lb 9.6 oz (63.8 kg)   SpO2 99%   BMI 22.69 kg/m   Patient complains of dysuria, frequency, and urgency. She has had symptoms for 3 days. Patient also complains of  suprapubic pain . Patient denies back pain, congestion, cough, fever, headache, rhinitis, sorethroat, stomach ache, and vaginal discharge. Patient does have a  history of recurrent UTI. Patient does not have a history of pyelonephritis.   Past Medical History:  Diagnosis Date   HLD (hyperlipidemia) 11/20/2018   Hypothyroidism, adult 11/20/2018   IBS (irritable bowel syndrome)    Prediabetes 11/20/2018   Thyroid disease    Vitamin D deficiency disease 11/20/2018     Allergies  Allergen Reactions   Sulfa Antibiotics Rash   Sulfasalazine Rash    Current Outpatient Medications on File Prior to Visit  Medication Sig   Cholecalciferol (VITAMIN D-3) 125 MCG (5000 UT) TABS Take 1 tablet by mouth daily.   Docusate Sodium (COLACE PO) Take by mouth daily.   Dulaglutide (TRULICITY) 3 MG/0.5ML SOPN Inject 1 pen (3 mg) into skin every 7 days for Diabetes  (Dx: e11.29)   levothyroxine (SYNTHROID) 88 MCG tablet Take  1 tablet  Daily except take 1/2 tab on Mon/Thurs. Take on an empty stomach with only water for 30 minutes & no Antacid meds, Calcium or Magnesium for 4 hours & avoid Biotin   linaCLOtide (LINZESS PO) Take by mouth daily.   ondansetron (ZOFRAN) 8 MG tablet Take 1/2 to 1 tablet 3 x /day as needed for Nausea   SENNA PO Take by mouth 2 (two) times daily.   topiramate (TOPAMAX) 50 MG tablet TAKE 1/2 TO 1 TABLET TWICE DAILY; AT SUPPERTIME AND BEDTIME FOR DIETING AND WEIGHT LOSS   cephALEXin (KEFLEX) 500 MG capsule Take as directed for  UTI prophylaxis (Patient not taking: Reported on 01/03/2023)   nitrofurantoin, macrocrystal-monohydrate, (MACROBID) 100 MG capsule Take  1 capsule  2 x /day  with Food for  UTI (Patient not taking: Reported on  01/03/2023)   No current facility-administered medications on file prior to visit.    ROS: all negative except what is noted in the HPI.   Physical Exam:  BP 110/68   Pulse 73   Temp 97.7 F (36.5 C)   Ht 5\' 6"  (1.676 m)   Wt 140 lb 9.6 oz (63.8 kg)   SpO2 99%   BMI 22.69 kg/m   General Appearance: NAD.  Awake, conversant and cooperative. Eyes: PERRLA, EOMs intact.  Sclera white.  Conjunctiva  without erythema. Sinuses: No frontal/maxillary tenderness.  No nasal discharge. Nares patent.  ENT/Mouth: Ext aud canals clear.  Bilateral TMs w/DOL and without erythema or bulging. Hearing intact.  Posterior pharynx without swelling or exudate.  Tonsils without swelling or erythema.  Neck: Supple.  No masses, nodules or thyromegaly. Respiratory: Effort is regular with non-labored breathing. Breath sounds are equal bilaterally without rales, rhonchi, wheezing or stridor.  Cardio: RRR with no MRGs. Brisk peripheral pulses without edema.  Abdomen: Active BS in all four quadrants.  Soft and non-tender without guarding, rebound tenderness, hernias or masses. Lymphatics: Non tender without lymphadenopathy.  Musculoskeletal: Full ROM, 5/5 strength, normal ambulation.  No clubbing or cyanosis. Skin: Appropriate color for ethnicity. Warm without rashes, lesions, ecchymosis, ulcers.  Neuro: CN II-XII grossly normal. Normal muscle tone without cerebellar symptoms and intact sensation.   Psych: AO X 3,  appropriate mood and affect, insight and judgment.     Adela Glimpse, NP 4:25 PM [facility]

## 2023-01-03 NOTE — Patient Instructions (Signed)
Urinary Tract Infection, Adult A urinary tract infection (UTI) is an infection of any part of the urinary tract. The urinary tract includes: The kidneys. The ureters. The bladder. The urethra. These organs make, store, and get rid of pee (urine) in the body. What are the causes? This infection is caused by germs (bacteria) in your genital area. These germs grow and cause swelling (inflammation) of your urinary tract. What increases the risk? The following factors may make you more likely to develop this condition: Using a small, thin tube (catheter) to drain pee. Not being able to control when you pee or poop (incontinence). Being female. If you are female, these things can increase the risk: Using these methods to prevent pregnancy: A medicine that kills sperm (spermicide). A device that blocks sperm (diaphragm). Having low levels of a female hormone (estrogen). Being pregnant. You are more likely to develop this condition if: You have genes that add to your risk. You are sexually active. You take antibiotic medicines. You have trouble peeing because of: A prostate that is bigger than normal, if you are female. A blockage in the part of your body that drains pee from the bladder. A kidney stone. A nerve condition that affects your bladder. Not getting enough to drink. Not peeing often enough. You have other conditions, such as: Diabetes. A weak disease-fighting system (immune system). Sickle cell disease. Gout. Injury of the spine. What are the signs or symptoms? Symptoms of this condition include: Needing to pee right away. Peeing small amounts often. Pain or burning when peeing. Blood in the pee. Pee that smells bad or not like normal. Trouble peeing. Pee that is cloudy. Fluid coming from the vagina, if you are female. Pain in the belly or lower back. Other symptoms include: Vomiting. Not feeling hungry. Feeling mixed up (confused). This may be the first symptom in  older adults. Being tired and grouchy (irritable). A fever. Watery poop (diarrhea). How is this treated? Taking antibiotic medicine. Taking other medicines. Drinking enough water. In some cases, you may need to see a specialist. Follow these instructions at home:  Medicines Take over-the-counter and prescription medicines only as told by your doctor. If you were prescribed an antibiotic medicine, take it as told by your doctor. Do not stop taking it even if you start to feel better. General instructions Make sure you: Pee until your bladder is empty. Do not hold pee for a long time. Empty your bladder after sex. Wipe from front to back after peeing or pooping if you are a female. Use each tissue one time when you wipe. Drink enough fluid to keep your pee pale yellow. Keep all follow-up visits. Contact a doctor if: You do not get better after 1-2 days. Your symptoms go away and then come back. Get help right away if: You have very bad back pain. You have very bad pain in your lower belly. You have a fever. You have chills. You feeling like you will vomit or you vomit. Summary A urinary tract infection (UTI) is an infection of any part of the urinary tract. This condition is caused by germs in your genital area. There are many risk factors for a UTI. Treatment includes antibiotic medicines. Drink enough fluid to keep your pee pale yellow. This information is not intended to replace advice given to you by your health care provider. Make sure you discuss any questions you have with your health care provider. Document Revised: 08/17/2019 Document Reviewed: 08/22/2019 Elsevier Patient Education    2024 [employer]  

## 2023-01-04 LAB — URINE CULTURE
MICRO NUMBER:: 15838752
SPECIMEN QUALITY:: ADEQUATE

## 2023-01-04 LAB — URINALYSIS, ROUTINE W REFLEX MICROSCOPIC
Bilirubin Urine: NEGATIVE
Glucose, UA: NEGATIVE
Hgb urine dipstick: NEGATIVE
Hyaline Cast: NONE SEEN /[LPF]
Ketones, ur: NEGATIVE
Nitrite: POSITIVE — AB
Protein, ur: NEGATIVE
RBC / HPF: NONE SEEN /HPF (ref 0–2)
Specific Gravity, Urine: 1.009 (ref 1.001–1.035)
Squamous Epithelial / HPF: NONE SEEN /HPF (ref <=5)
pH: 7 (ref 5.0–8.0)

## 2023-01-09 ENCOUNTER — Encounter: Payer: Self-pay | Admitting: Nurse Practitioner

## 2023-01-09 DIAGNOSIS — N3000 Acute cystitis without hematuria: Secondary | ICD-10-CM

## 2023-01-09 MED ORDER — NITROFURANTOIN MONOHYD MACRO 100 MG PO CAPS: 100.0000 mg | ORAL_CAPSULE | Freq: Two times a day (BID) | ORAL | 0 refills | Status: AC

## 2023-01-12 DIAGNOSIS — N952 Postmenopausal atrophic vaginitis: Secondary | ICD-10-CM | POA: Diagnosis not present

## 2023-01-12 DIAGNOSIS — R8271 Bacteriuria: Secondary | ICD-10-CM | POA: Diagnosis not present

## 2023-01-12 DIAGNOSIS — N341 Nonspecific urethritis: Secondary | ICD-10-CM | POA: Diagnosis not present

## 2023-01-16 ENCOUNTER — Encounter: Payer: Medicare Other | Admitting: Internal Medicine

## 2023-01-30 ENCOUNTER — Encounter: Payer: Medicare Other | Admitting: Internal Medicine

## 2023-01-31 ENCOUNTER — Encounter: Payer: Self-pay | Admitting: Internal Medicine

## 2023-03-05 ENCOUNTER — Encounter: Payer: Medicare Other | Admitting: Internal Medicine

## 2023-03-22 DIAGNOSIS — E039 Hypothyroidism, unspecified: Secondary | ICD-10-CM | POA: Diagnosis not present

## 2023-04-18 ENCOUNTER — Ambulatory Visit (INDEPENDENT_AMBULATORY_CARE_PROVIDER_SITE_OTHER): Payer: BC Managed Care – PPO | Admitting: Internal Medicine

## 2023-04-18 VITALS — BP 112/68 | HR 94 | Ht 66.0 in | Wt 141.4 lb

## 2023-04-18 DIAGNOSIS — N182 Chronic kidney disease, stage 2 (mild): Secondary | ICD-10-CM

## 2023-04-18 DIAGNOSIS — I471 Supraventricular tachycardia, unspecified: Secondary | ICD-10-CM | POA: Insufficient documentation

## 2023-04-18 DIAGNOSIS — E785 Hyperlipidemia, unspecified: Secondary | ICD-10-CM | POA: Diagnosis not present

## 2023-04-18 DIAGNOSIS — E1122 Type 2 diabetes mellitus with diabetic chronic kidney disease: Secondary | ICD-10-CM | POA: Diagnosis not present

## 2023-04-18 DIAGNOSIS — R300 Dysuria: Secondary | ICD-10-CM

## 2023-04-18 DIAGNOSIS — E1169 Type 2 diabetes mellitus with other specified complication: Secondary | ICD-10-CM

## 2023-04-18 DIAGNOSIS — Z7985 Long-term (current) use of injectable non-insulin antidiabetic drugs: Secondary | ICD-10-CM

## 2023-04-18 DIAGNOSIS — E039 Hypothyroidism, unspecified: Secondary | ICD-10-CM

## 2023-04-18 DIAGNOSIS — K5909 Other constipation: Secondary | ICD-10-CM

## 2023-04-18 DIAGNOSIS — E559 Vitamin D deficiency, unspecified: Secondary | ICD-10-CM

## 2023-04-18 NOTE — Assessment & Plan Note (Addendum)
 Managed with Trulicity , not taking Crestor.   Advised patient to continue statin  if tolerated regardless of LDL. Labs ordered   Lab Results  Component Value Date   HGBA1C 5.5 06/05/2022   Lab Results  Component Value Date   CHOL 186 06/05/2022   HDL 49 (L) 06/05/2022   LDLCALC 112 (H) 06/05/2022   TRIG 130 06/05/2022   CHOLHDL 3.8 06/05/2022

## 2023-04-18 NOTE — Patient Instructions (Signed)
 Welcome!!  I enjoyed meeting you today   RECAP:  1) I agree with use of estrogen cream around the urethra to treat menopause related atrophy   2) rosuvastatin would be standard of care even if your cholesterol was normal without it because of the diabetes   3) the next time you feel you have a UTI,  you may collect a urine sample in the sterile container I gave you. Call the office to get put on the lab schedule and a brief visit with me in 48 hours  .  I will treat without waiting for the culture if the symptoms are severe

## 2023-04-18 NOTE — Assessment & Plan Note (Signed)
 Diagnosed years ago secondary to Hashimoto's thyroiditis,    has been taking levothyroxine on a stable dose of 88 mcg 5 days per week and 2 days without any  medication. She reports that her diagnosis has been doubted by PCPs in the past   Lab Results  Component Value Date   TSH 7.14 (H) 11/09/2021

## 2023-04-18 NOTE — Progress Notes (Unsigned)
 Subjective:  Patient ID: Jennifer Herrera, female    DOB: 1955-08-01  Age: 68 y.o. MRN: [id number]  CC: There were no encounter diagnoses.   HPI Jennifer Herrera presents for  Chief Complaint  Patient presents with   Establish Care    1) recurrent utis  last one docuemntein Dec wsa not diangositic ,  using estorgen cream on urethra.   2) chronic constipation   uses a regimen of linzess prn ,  uses , colace and sennakot (3 to 5 daily)  per GI .  Averages 1-2 BMs per week. (Regimen ok'd by GI)   3) T2DM diagnosed in 2008. Originally treated with metformin , then changed providers and trulicity started.  Tolerating well without hypoglycemia. Not checking sugars    4) hypothyroid:stable  regiem n if levoythroxine   5)   Lab Results  Component Value Date   MICROALBUR <0.2 01/10/2022       Outpatient Medications Prior to Visit  Medication Sig Dispense Refill   Cholecalciferol (VITAMIN D-3) 125 MCG (5000 UT) TABS Take 1 tablet by mouth daily.     Docusate Sodium (COLACE PO) Take by mouth daily.     Dulaglutide (TRULICITY) 3 MG/0.5ML SOPN Inject 1 pen (3 mg) into skin every 7 days for Diabetes  (Dx: e11.29) 6 mL 3   levothyroxine (SYNTHROID) 88 MCG tablet Take  1 tablet  Daily except take 1/2 tab on Mon/Thurs. Take on an empty stomach with only water for 30 minutes & no Antacid meds, Calcium or Magnesium for 4 hours & avoid Biotin 90 tablet 3   linaCLOtide (LINZESS PO) Take by mouth daily.     ondansetron (ZOFRAN) 8 MG tablet Take 1/2 to 1 tablet 3 x /day as needed for Nausea 90 tablet 1   SENNA PO Take by mouth 2 (two) times daily.     cephALEXin (KEFLEX) 500 MG capsule Take as directed for  UTI prophylaxis (Patient not taking: Reported on 04/18/2023) 40 capsule 0   estradiol (ESTRACE) 0.1 MG/GM vaginal cream Place vaginally. (Patient not taking: Reported on 04/18/2023)     rosuvastatin (CRESTOR) 10 MG tablet Take 10 mg by mouth daily. (Patient not taking: Reported on 04/18/2023)      topiramate (TOPAMAX) 50 MG tablet TAKE 1/2 TO 1 TABLET TWICE DAILY; AT SUPPERTIME AND BEDTIME FOR DIETING AND WEIGHT LOSS (Patient not taking: Reported on 04/18/2023) 180 tablet 1   No facility-administered medications prior to visit.    Review of Systems;  Patient denies headache, fevers, malaise, unintentional weight loss, skin rash, eye pain, sinus congestion and sinus pain, sore throat, dysphagia,  hemoptysis , cough, dyspnea, wheezing, chest pain, palpitations, orthopnea, edema, abdominal pain, nausea, melena, diarrhea, constipation, flank pain, dysuria, hematuria, urinary  Frequency, nocturia, numbness, tingling, seizures,  Focal weakness, Loss of consciousness,  Tremor, insomnia, depression, anxiety, and suicidal ideation.      Objective:  BP 112/68   Pulse 94   Ht 5\' 6"  (1.676 m)   Wt 141 lb 6.4 oz (64.1 kg)   SpO2 98%   BMI 22.82 kg/m   BP Readings from Last 3 Encounters:  04/18/23 112/68  01/03/23 110/68  06/05/22 96/60    Wt Readings from Last 3 Encounters:  04/18/23 141 lb 6.4 oz (64.1 kg)  01/03/23 140 lb 9.6 oz (63.8 kg)  06/05/22 134 lb 6.4 oz (61 kg)    Physical Exam  Lab Results  Component Value Date   HGBA1C 5.5 06/05/2022   HGBA1C  5.8 (H) 01/10/2022   HGBA1C 5.8 (H) 09/06/2021    Lab Results  Component Value Date   CREATININE 1.26 (H) 06/05/2022   CREATININE 1.39 (H) 01/10/2022   CREATININE 0.83 09/20/2021    Lab Results  Component Value Date   WBC 7.8 06/05/2022   HGB 13.2 06/05/2022   HCT 38.7 06/05/2022   PLT 295 06/05/2022   GLUCOSE 90 06/05/2022   CHOL 186 06/05/2022   TRIG 130 06/05/2022   HDL 49 (L) 06/05/2022   LDLCALC 112 (H) 06/05/2022   ALT 12 06/05/2022   AST 14 06/05/2022   NA 138 06/05/2022   K 4.6 06/05/2022   CL 105 06/05/2022   CREATININE 1.26 (H) 06/05/2022   BUN 14 06/05/2022   CO2 26 06/05/2022   TSH 7.14 (H) 11/09/2021   HGBA1C 5.5 06/05/2022   MICROALBUR <0.2 01/10/2022    CT ABDOMEN PELVIS W  CONTRAST Result Date: 09/20/2021 CLINICAL DATA:  Left lower quadrant abdominal pain. EXAM: CT ABDOMEN AND PELVIS WITH CONTRAST TECHNIQUE: Multidetector CT imaging of the abdomen and pelvis was performed using the standard protocol following bolus administration of intravenous contrast. RADIATION DOSE REDUCTION: This exam was performed according to the departmental dose-optimization program which includes automated exposure control, adjustment of the mA and/or kV according to patient size and/or use of iterative reconstruction technique. CONTRAST:  75mL OMNIPAQUE IOHEXOL 300 MG/ML  SOLN COMPARISON:  06/08/2017 FINDINGS: Lower chest: Stable subpleural nodule at the lateral left lung base measuring up to 8 mm in maximum diameter and previously noted to be stable benign. Hepatobiliary: No focal liver abnormality is seen. No gallstones, gallbladder wall thickening, or biliary dilatation. Pancreas: Unremarkable. No pancreatic ductal dilatation or surrounding inflammatory changes. Spleen: Normal in size without focal abnormality. Adrenals/Urinary Tract: Normal adrenal glands. Mild to moderate left-sided hydronephrosis and hydroureter. As the ureter is followed into the pelvis, there is a partially obstructing distal ureteral calculus measuring up to 5 mm on coronal reconstructions and located just proximal to the ureterovesical junction. Delayed imaging demonstrates delayed excretion of contrast into the left renal collecting system compared to the right. The right kidney is normal. The bladder is unremarkable. Stomach/Bowel: Bowel shows no evidence of obstruction, ileus, inflammation or lesion. The appendix is normal. No free intraperitoneal air. Vascular/Lymphatic: No significant vascular findings are present. No enlarged abdominal or pelvic lymph nodes. Reproductive: Uterus and bilateral adnexa are unremarkable. Other: No abdominal wall hernia or abnormality. No abdominopelvic ascites. Musculoskeletal: No acute or  significant osseous findings. Degenerative disc disease at L5-S1. IMPRESSION: 5 mm distal left ureteral calculus causing mild to moderate left-sided hydronephrosis and hydroureter. Electronically Signed   By: Irish Lack M.D.   On: 09/20/2021 16:57    Assessment & Plan:  .There are no diagnoses linked to this encounter.   I spent 34 minutes on the day of this face to face encounter reviewing patient's  most recent visit with cardiology,  nephrology,  and neurology,  prior relevant surgical and non surgical procedures, recent  labs and imaging studies, counseling on weight management,  reviewing the assessment and plan with patient, and post visit ordering and reviewing of  diagnostics and therapeutics with patient  .   Follow-up: No follow-ups on file.   Sherlene Shams, MD

## 2023-04-19 DIAGNOSIS — B952 Enterococcus as the cause of diseases classified elsewhere: Secondary | ICD-10-CM | POA: Insufficient documentation

## 2023-04-19 DIAGNOSIS — R300 Dysuria: Secondary | ICD-10-CM | POA: Insufficient documentation

## 2023-04-19 LAB — COMPREHENSIVE METABOLIC PANEL WITH GFR
ALT: 14 U/L (ref 0–35)
AST: 16 U/L (ref 0–37)
Albumin: 4.4 g/dL (ref 3.5–5.2)
Alkaline Phosphatase: 84 U/L (ref 39–117)
BUN: 15 mg/dL (ref 6–23)
CO2: 29 meq/L (ref 19–32)
Calcium: 9.9 mg/dL (ref 8.4–10.5)
Chloride: 100 meq/L (ref 96–112)
Creatinine, Ser: 1.05 mg/dL (ref 0.40–1.20)
GFR: 54.54 mL/min — ABNORMAL LOW (ref >60.00)
Glucose, Bld: 90 mg/dL (ref 70–99)
Potassium: 4.6 meq/L (ref 3.5–5.1)
Sodium: 136 meq/L (ref 135–145)
Total Bilirubin: 1.2 mg/dL (ref 0.2–1.2)
Total Protein: 7.2 g/dL (ref 6.0–8.3)

## 2023-04-19 LAB — MICROALBUMIN / CREATININE URINE RATIO
Creatinine,U: 26.3 mg/dL
Microalb Creat Ratio: UNDETERMINED mg/g (ref 0.0–30.0)
Microalb, Ur: -0.1 mg/dL — ABNORMAL LOW (ref 0.0–1.9)

## 2023-04-19 LAB — LIPID PANEL
Cholesterol: 163 mg/dL (ref 0–200)
HDL: 43.9 mg/dL (ref >39.00)
LDL Cholesterol: 91 mg/dL (ref 0–99)
NonHDL: 118.7
Total CHOL/HDL Ratio: 4
Triglycerides: 138 mg/dL (ref 0.0–149.0)
VLDL: 27.6 mg/dL (ref 0.0–40.0)

## 2023-04-19 LAB — HEMOGLOBIN A1C: Hgb A1c MFr Bld: 5.8 % (ref 4.6–6.5)

## 2023-04-19 NOTE — Assessment & Plan Note (Signed)
Managed with supplementation

## 2023-04-19 NOTE — Assessment & Plan Note (Signed)
 Infrequent,  attributed to dehydration per report

## 2023-04-19 NOTE — Assessment & Plan Note (Signed)
 Managed with Linzess, clace and Sennakot per GI.  Averaging 1-2 BMS per week

## 2023-04-19 NOTE — Assessment & Plan Note (Signed)
 Reviewed difference between irritation and infection.  Explained that use of topical estrogen would relieve the atrophic changes around her urethra that cause her to feel like a UTI is present.  Given a labelled soecimen container for collection of urine for next occurrence

## 2023-04-20 ENCOUNTER — Encounter: Payer: Self-pay | Admitting: Internal Medicine

## 2023-04-21 ENCOUNTER — Encounter: Payer: Self-pay | Admitting: Internal Medicine

## 2023-04-21 DIAGNOSIS — E1122 Type 2 diabetes mellitus with diabetic chronic kidney disease: Secondary | ICD-10-CM

## 2023-05-02 DIAGNOSIS — M85851 Other specified disorders of bone density and structure, right thigh: Secondary | ICD-10-CM

## 2023-06-04 DIAGNOSIS — R7989 Other specified abnormal findings of blood chemistry: Secondary | ICD-10-CM | POA: Diagnosis not present

## 2023-06-04 DIAGNOSIS — E785 Hyperlipidemia, unspecified: Secondary | ICD-10-CM | POA: Diagnosis not present

## 2023-06-04 DIAGNOSIS — N189 Chronic kidney disease, unspecified: Secondary | ICD-10-CM | POA: Diagnosis not present

## 2023-06-04 DIAGNOSIS — E1122 Type 2 diabetes mellitus with diabetic chronic kidney disease: Secondary | ICD-10-CM | POA: Diagnosis not present

## 2023-06-04 LAB — LAB REPORT - SCANNED
Albumin, Urine POC: 3
Albumin/Creatinine Ratio, Urine, POC: 3
Creatinine, POC: 98.5 mg/dL

## 2023-06-05 ENCOUNTER — Ambulatory Visit: Payer: Medicare Other | Admitting: Nurse Practitioner

## 2023-06-08 ENCOUNTER — Encounter: Payer: Self-pay | Admitting: Internal Medicine

## 2023-06-11 ENCOUNTER — Other Ambulatory Visit: Payer: Self-pay | Admitting: Internal Medicine

## 2023-06-11 ENCOUNTER — Encounter: Payer: Self-pay | Admitting: Internal Medicine

## 2023-06-11 ENCOUNTER — Telehealth: Admitting: Internal Medicine

## 2023-06-11 DIAGNOSIS — R300 Dysuria: Secondary | ICD-10-CM

## 2023-06-11 DIAGNOSIS — I5030 Unspecified diastolic (congestive) heart failure: Secondary | ICD-10-CM

## 2023-06-11 DIAGNOSIS — N182 Chronic kidney disease, stage 2 (mild): Secondary | ICD-10-CM | POA: Diagnosis not present

## 2023-06-11 DIAGNOSIS — I130 Hypertensive heart and chronic kidney disease with heart failure and stage 1 through stage 4 chronic kidney disease, or unspecified chronic kidney disease: Secondary | ICD-10-CM | POA: Diagnosis not present

## 2023-06-11 DIAGNOSIS — E1122 Type 2 diabetes mellitus with diabetic chronic kidney disease: Secondary | ICD-10-CM

## 2023-06-11 DIAGNOSIS — E1169 Type 2 diabetes mellitus with other specified complication: Secondary | ICD-10-CM

## 2023-06-11 DIAGNOSIS — N183 Chronic kidney disease, stage 3 unspecified: Secondary | ICD-10-CM

## 2023-06-11 DIAGNOSIS — R748 Abnormal levels of other serum enzymes: Secondary | ICD-10-CM

## 2023-06-11 DIAGNOSIS — N261 Atrophy of kidney (terminal): Secondary | ICD-10-CM

## 2023-06-11 DIAGNOSIS — B952 Enterococcus as the cause of diseases classified elsewhere: Secondary | ICD-10-CM

## 2023-06-11 MED ORDER — TRULICITY 3 MG/0.5ML ~~LOC~~ SOAJ: SUBCUTANEOUS | 3 refills | Status: DC

## 2023-06-11 NOTE — Progress Notes (Signed)
 Virtual Visit via Caregility   Note   This format is felt to be most appropriate for this patient at this time.  All issues noted in this document were discussed and addressed.  No physical exam was performed (except for noted visual exam findings with Video Visits).   I connected with Jennifer Herrera  on 06/11/23 at  5:00 PM EDT by a video enabled telemedicine application and verified that I am speaking with the correct person using two identifiers. Location patient: home Location provider: work or home office Persons participating in the virtual visit: patient, provider  I discussed the limitations, risks, security and privacy concerns of performing an evaluation and management service by telephone and the availability of in person appointments. I also discussed with the patient that there may be a patient responsible charge related to this service. The patient expressed understanding and agreed to proceed. Reason for visit: FOLLOW UP ON MULTIPLE ISSUES   HPI:  Jennifer Herrera is a 68 yr old female with type 2 DM managed with Trulicity ,  recurrent UTIs  and previusly undiagnosed CKD who presents for follow up on multiple issues   1) new onset DYSURIA, STRONG ODOR OF URINE IN AM.  RIGHT SIDED BACK /CVA PAIN IN THE SETTING OF RECENT PHYSICALLY DEMANDING HOUSECLEANING over the weekend. No fEVERS, NAUSEA,  SUPRAPUBIC PAIN OR  HEMATURIA   2) CKD STAGE 3 B: BASED ON RECENT NEPHROLOGY REFERRAL.  Etiology remains unclear.  No protenuria.    NO RENAL ULTRASOUND DONE . HISTORY OF HYDRONEPHROSIS SECONDARY TO STONES ,NOTED ON 2023 CR ABD AND PELVIS.   3) ELEVATED UROBILINOGEN .  Noted  on urinalysis done by nephrology .  She notes a FAMILY HISTORY OF NASH IN FATHER  and is concerned that she may have liver disease.  She does not drink alcohol or take NSAIDS daily    ROS: See pertinent positives and negatives per HPI.  Past Medical History:  Diagnosis Date   Allergy 1962   Sulfa rash   Diabetes mellitus without  complication (HCC) 2010   HLD (hyperlipidemia) 11/20/2018   Hypothyroidism, adult 11/20/2018   IBS (irritable bowel syndrome)    Prediabetes 11/20/2018   Thyroid  disease    Vitamin D  deficiency disease 11/20/2018    Past Surgical History:  Procedure Laterality Date   ANAL RECTAL MANOMETRY N/A 08/15/2019   Procedure: ANO RECTAL MANOMETRY;  Surgeon: Baldo Bonds, MD;  Location: [facility];  Service: Endoscopy;  Laterality: N/A;   AUGMENTATION MAMMAPLASTY Bilateral 1979   saline   BREAST ENHANCEMENT SURGERY     COSMETIC SURGERY  1979   Multiple operations   FACIAL COSMETIC SURGERY     SHOULDER ARTHROSCOPY     TONSILLECTOMY     TUBAL LIGATION  1993    Family History  Problem Relation Age of Onset   Cancer Mother    Hypertension Mother    Cirrhosis Father    Cancer Sister    Alcohol abuse Brother    Diabetes Brother    Hypertension Brother    Diabetes Maternal Aunt     SOCIAL HX:  reports that she has never smoked. She has never used smokeless tobacco. She reports current alcohol use of about 2.0 standard drinks of alcohol per week. She reports that she does not use drugs.    Current Outpatient Medications:    Cholecalciferol (VITAMIN D -3) 125 MCG (5000 UT) TABS, Take 1 tablet by mouth daily., Disp: , Rfl:    Docusate Sodium  (COLACE PO), Take by  mouth daily., Disp: , Rfl:    estradiol  (ESTRACE ) 0.1 MG/GM vaginal cream, Place vaginally., Disp: , Rfl:    levothyroxine  (SYNTHROID ) 88 MCG tablet, Take  1 tablet  Daily except take 1/2 tab on Mon/Thurs. Take on an empty stomach with only water for 30 minutes & no Antacid meds, Calcium  or Magnesium for 4 hours & avoid Biotin, Disp: 90 tablet, Rfl: 3   linaCLOtide (LINZESS PO), Take by mouth daily., Disp: , Rfl:    ondansetron  (ZOFRAN ) 8 MG tablet, Take 1/2 to 1 tablet 3 x /day as needed for Nausea, Disp: 90 tablet, Rfl: 1   SENNA PO, Take by mouth 2 (two) times daily., Disp: , Rfl:    Dulaglutide  (TRULICITY ) 3 MG/0.5ML SOAJ,  Inject 1 pen (3 mg) into skin every 7 days for Diabetes  (Dx: e11.29), Disp: 6 mL, Rfl: 3   [START ON 06/14/2023] rosuvastatin  (CRESTOR ) 10 MG tablet, Take 1 tablet (10 mg total) by mouth 2 (two) times a week., Disp: 24 tablet, Rfl: 0  EXAM:  VITALS per patient if applicable:  GENERAL: alert, oriented, appears well and in no acute distress  HEENT: atraumatic, conjunttiva clear, no obvious abnormalities on inspection of external nose and ears  NECK: normal movements of the head and neck  LUNGS: on inspection no signs of respiratory distress, breathing rate appears normal, no obvious gross SOB, gasping or wheezing  CV: no obvious cyanosis  MS: moves all visible extremities without noticeable abnormality  PSYCH/NEURO: pleasant and cooperative, no obvious depression or anxiety, speech and thought processing grossly intact  ASSESSMENT AND PLAN: 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  She has no proteinuria as would be expected if diabetes is the cause.  Given her history of hydronephrosis in 2023 secondary to ureteral calculi, will order renal ultrasound.  Advised to avoid all NSAIDs.  Follow up with nephrology in 6  months   Orders: -     US  RENAL; Future  Type 2 diabetes mellitus with stage 2 chronic kidney disease, without long-term current use of insulin  (HCC) -     Trulicity ; Inject 1 pen (3 mg) into skin every 7 days for Diabetes  (Dx: e11.29)  Dispense: 6 mL; Refill: 3  Hyperlipidemia associated with type 2 diabetes mellitus (HCC) Assessment & Plan: A1c  is at goal with Trulicity  , however she has avoided taking Crestor  regularly for primary prevention because of knee pain which she attributes to statin. .  Willing to  try taking it  twice weekly    Lab Results  Component Value Date   HGBA1C 5.8 04/18/2023   Lab Results  Component Value Date   CHOL 163 04/18/2023   HDL 43.90 04/18/2023   LDLCALC 91 04/18/2023   TRIG 138.0 04/18/2023   CHOLHDL 4 04/18/2023      Other orders -     Rosuvastatin  Calcium ; Take 1 tablet (10 mg total) by mouth 2 (two) times a week.  Dispense: 24 tablet; Refill: 0      I discussed the assessment and treatment plan with the patient.  The patient was provided an opportunity to ask questions and all were answered. The patient agreed with the plan and demonstrated an understanding of the instructions.   The patient was advised to call back or seek an in-person evaluation if the symptoms worsen or if the condition fails to improve as anticipated.   I spent 30 minutes dedicated to the care of this patient on the date of this encounter to include pre-visit review of patient's medical history,  including recent nephrology evaluation ,  the last 3 years of  imaging studies and labs, face-to-face time with the patient , and post visit ordering of testing and therapeutics.    Thersia Flax, MD

## 2023-06-12 ENCOUNTER — Other Ambulatory Visit (INDEPENDENT_AMBULATORY_CARE_PROVIDER_SITE_OTHER)

## 2023-06-12 DIAGNOSIS — R300 Dysuria: Secondary | ICD-10-CM | POA: Diagnosis not present

## 2023-06-12 DIAGNOSIS — N183 Chronic kidney disease, stage 3 unspecified: Secondary | ICD-10-CM | POA: Insufficient documentation

## 2023-06-12 DIAGNOSIS — R748 Abnormal levels of other serum enzymes: Secondary | ICD-10-CM | POA: Insufficient documentation

## 2023-06-12 LAB — URINALYSIS, ROUTINE W REFLEX MICROSCOPIC
Bilirubin Urine: NEGATIVE
Hgb urine dipstick: NEGATIVE
Ketones, ur: NEGATIVE
Nitrite: POSITIVE — AB
Specific Gravity, Urine: 1.015 (ref 1.000–1.030)
Total Protein, Urine: NEGATIVE
Urine Glucose: NEGATIVE
Urobilinogen, UA: 0.2 (ref 0.0–1.0)
pH: 6 (ref 5.0–8.0)

## 2023-06-12 MED ORDER — ROSUVASTATIN CALCIUM 10 MG PO TABS: 10.0000 mg | ORAL_TABLET | ORAL | 0 refills | Status: AC

## 2023-06-12 NOTE — Assessment & Plan Note (Signed)
 A1c is at goal with Trulicity  , however she has avoided taking Crestor  regularly for primary prevention because of knee pain which she attributes to statin. .  Willing to  try taking it  twice weekly    Lab Results  Component Value Date   HGBA1C 5.8 04/18/2023   Lab Results  Component Value Date   CHOL 163 04/18/2023   HDL 43.90 04/18/2023   LDLCALC 91 04/18/2023   TRIG 138.0 04/18/2023   CHOLHDL 4 04/18/2023

## 2023-06-12 NOTE — Assessment & Plan Note (Signed)
 Noted once in Feb 2023 w, have been normal since then but has elevated urobilinogen on recent UA abd ultrasound ordered

## 2023-06-12 NOTE — Assessment & Plan Note (Signed)
 Etiology unclear despite nephrology evaluation.  She has no proteinuria as would be expected if diabetes is the cause.  Given her history of hydronephrosis in 2023 secondary to ureteral calculi, will order renal ultrasound.  Advised to avoid all NSAIDs.  Follow up with nephrology in 6  months

## 2023-06-14 ENCOUNTER — Ambulatory Visit: Payer: Self-pay | Admitting: Internal Medicine

## 2023-06-14 LAB — URINE CULTURE
MICRO NUMBER:: 16478687
SPECIMEN QUALITY:: ADEQUATE

## 2023-06-14 MED ORDER — CIPROFLOXACIN HCL 250 MG PO TABS: 250.0000 mg | ORAL_TABLET | Freq: Two times a day (BID) | ORAL | 0 refills | Status: AC

## 2023-06-14 NOTE — Addendum Note (Signed)
 Addended by: Thersia Flax on: 06/14/2023 03:06 PM   Modules accepted: Orders

## 2023-06-14 NOTE — Assessment & Plan Note (Signed)
 E Coli sensitive to cipro .  Rx sent

## 2023-06-19 ENCOUNTER — Ambulatory Visit
Admission: RE | Admit: 2023-06-19 | Discharge: 2023-06-19 | Disposition: A | Discharge status: Home / Self Care | Source: Ambulatory Visit | Attending: Internal Medicine | Admitting: Internal Medicine

## 2023-06-19 DIAGNOSIS — Z8379 Family history of other diseases of the digestive system: Secondary | ICD-10-CM | POA: Diagnosis not present

## 2023-06-19 DIAGNOSIS — R748 Abnormal levels of other serum enzymes: Secondary | ICD-10-CM | POA: Diagnosis not present

## 2023-06-20 ENCOUNTER — Ambulatory Visit: Payer: Self-pay | Admitting: Internal Medicine

## 2023-06-20 DIAGNOSIS — N261 Atrophy of kidney (terminal): Secondary | ICD-10-CM

## 2023-06-20 DIAGNOSIS — Z87442 Personal history of urinary calculi: Secondary | ICD-10-CM

## 2023-06-20 DIAGNOSIS — N1831 Chronic kidney disease, stage 3a: Secondary | ICD-10-CM

## 2023-06-22 ENCOUNTER — Ambulatory Visit
Admission: RE | Admit: 2023-06-22 | Discharge: 2023-06-22 | Disposition: A | Discharge status: Home / Self Care | Source: Ambulatory Visit | Attending: Internal Medicine | Admitting: Internal Medicine

## 2023-06-22 DIAGNOSIS — N183 Chronic kidney disease, stage 3 unspecified: Secondary | ICD-10-CM | POA: Insufficient documentation

## 2023-06-26 ENCOUNTER — Telehealth: Payer: Self-pay

## 2023-06-26 ENCOUNTER — Encounter: Payer: Self-pay | Admitting: Internal Medicine

## 2023-06-26 NOTE — Telephone Encounter (Signed)
 Copied from CRM [id number]. Topic: Clinical - Lab/Test Results >> Jun 26, 2023 11:15 AM Jennifer Herrera wrote: Reason for CRM: Patient is concern about kidney results from testing and would like to hear from Dr Madelon Scheuermann about it

## 2023-06-26 NOTE — Telephone Encounter (Signed)
 Looks like urine microalbumin lab result was scanned into pt's chart on 06/04/2023.

## 2023-06-27 NOTE — Telephone Encounter (Signed)
 Spoke with the reading room and they have moved it up to urgent.

## 2023-06-27 NOTE — Telephone Encounter (Signed)
 Spoke with pt and she stated that it was not in regards to the urine studies it was in regards to the renal US . I let her know that it has not been read yet but that we would call the reading room to see if they could expedite it.

## 2023-06-28 DIAGNOSIS — N261 Atrophy of kidney (terminal): Secondary | ICD-10-CM | POA: Insufficient documentation

## 2023-08-08 DIAGNOSIS — K5900 Constipation, unspecified: Secondary | ICD-10-CM | POA: Diagnosis not present

## 2023-08-09 ENCOUNTER — Ambulatory Visit: Payer: Self-pay | Admitting: Urology

## 2023-08-09 ENCOUNTER — Ambulatory Visit: Admitting: Urology

## 2023-08-09 VITALS — BP 104/66 | HR 69 | Ht 66.0 in | Wt 138.0 lb

## 2023-08-09 DIAGNOSIS — N261 Atrophy of kidney (terminal): Secondary | ICD-10-CM | POA: Diagnosis not present

## 2023-08-09 DIAGNOSIS — R3989 Other symptoms and signs involving the genitourinary system: Secondary | ICD-10-CM

## 2023-08-09 DIAGNOSIS — N390 Urinary tract infection, site not specified: Secondary | ICD-10-CM | POA: Diagnosis not present

## 2023-08-09 DIAGNOSIS — R1032 Left lower quadrant pain: Secondary | ICD-10-CM | POA: Diagnosis not present

## 2023-08-09 LAB — URINALYSIS, COMPLETE
Bilirubin, UA: NEGATIVE
Glucose, UA: NEGATIVE
Ketones, UA: NEGATIVE
Nitrite, UA: NEGATIVE
Protein,UA: NEGATIVE
Specific Gravity, UA: 1.01 (ref 1.005–1.030)
Urobilinogen, Ur: 1 mg/dL (ref 0.2–1.0)
pH, UA: 6 (ref 5.0–7.5)

## 2023-08-09 LAB — MICROSCOPIC EXAMINATION

## 2023-08-09 MED ORDER — AMOXICILLIN-POT CLAVULANATE 875-125 MG PO TABS: 1.0000 | ORAL_TABLET | Freq: Two times a day (BID) | ORAL | 0 refills | Status: AC

## 2023-08-09 NOTE — Telephone Encounter (Signed)
Called pt informed her of the information below. Pt voiced understanding. RX sent.  

## 2023-08-09 NOTE — Progress Notes (Signed)
 08/09/23 11:39 AM   Jennifer Herrera [birth year] [id number]  CC: Left renal atrophy, CKD, recurrent UTI, history of nephrolithiasis  HPI: 68 year old female referred for the above issues.  She has CKD stage IIIa and recent renal ultrasound showed left renal atrophy with some pelviectasis, normal right kidney.  She has a history of spontaneously passing a left-sided ureteral stone in 2023.  She denies any flank pain.  She also has had problems with recurrent UTIs that has been lifelong.  PCP prescribed topical estrogen cream but she has not been consistent with that.  She feels she could have a UTI today.   PMH: Past Medical History:  Diagnosis Date   Allergy 1962   Sulfa rash   Diabetes mellitus without complication (HCC) 2010   HLD (hyperlipidemia) 11/20/2018   Hypothyroidism, adult 11/20/2018   IBS (irritable bowel syndrome)    Prediabetes 11/20/2018   Thyroid  disease    Vitamin D  deficiency disease 11/20/2018    Surgical History: Past Surgical History:  Procedure Laterality Date   ANAL RECTAL MANOMETRY N/A 08/15/2019   Procedure: ANO RECTAL MANOMETRY;  Surgeon: Dianna Specking, MD;  Location: [facility];  Service: Endoscopy;  Laterality: N/A;   AUGMENTATION MAMMAPLASTY Bilateral 1979   saline   BREAST ENHANCEMENT SURGERY     COSMETIC SURGERY  1979   Multiple operations   FACIAL COSMETIC SURGERY     SHOULDER ARTHROSCOPY     TONSILLECTOMY     TUBAL LIGATION  1993    Family History: Family History  Problem Relation Age of Onset   Cancer Mother    Hypertension Mother    Cirrhosis Father    Cancer Sister    Alcohol abuse Brother    Diabetes Brother    Hypertension Brother    Diabetes Maternal Aunt     Social History:  reports that she has never smoked. She has never used smokeless tobacco. She reports current alcohol use of about 2.0 standard drinks of alcohol per week. She reports that she does not use drugs.  Physical Exam: BP 104/66 (BP Location: Left  Arm, Patient Position: Sitting, Cuff Size: Normal)   Pulse 69   Ht 5' 6 (1.676 m)   Wt 138 lb (62.6 kg)   SpO2 97%   BMI 22.27 kg/m    Constitutional:  Alert and oriented, No acute distress. Cardiovascular: No clubbing, cyanosis, or edema. Respiratory: Normal respiratory effort, no increased work of breathing. GI: Abdomen is soft, nontender, nondistended, no abdominal masses  Laboratory Data: Urinalysis today pending  Pertinent Imaging: I have personally viewed and interpreted the prior CT scan showing a 5 mm distal ureteral stone with hydronephrosis, most recent renal ultrasound showing left renal atrophy and pelviectasis.  Assessment & Plan:   68 year old female with recurrent UTIs and left renal atrophy with mild pelviectasis of unclear etiology.  I recommended a CT for better evaluation and possible ureteral stones or other etiology.  We discussed the evaluation and treatment of patients with recurrent UTIs at length.  We specifically discussed the differences between asymptomatic bacteriuria and true urinary tract infection.  We discussed the AUA definition of recurrent UTI of at least 2 culture proven symptomatic acute cystitis episodes in a 74-month period, or 3 within a 1 year period.  We discussed the importance of culture directed antibiotic treatment, and antibiotic stewardship.  First-line therapy includes nitrofurantoin (5 days), Bactrim(3 days), or fosfomycin(3 g single dose).  Possible etiologies of recurrent infection include periurethral tissue atrophy in postmenopausal woman, constipation,  sexual activity, incomplete emptying, anatomic abnormalities, and even genetic predisposition.  Finally, we discussed the role of perineal hygiene, timed voiding, adequate hydration, topical vaginal estrogen, cranberry prophylaxis, and low-dose antibiotic prophylaxis.  Cranberry tablets and recommended topical estrogen cream for recurrent UTIs Follow-up urinalysis, culture, atypicals  today Call with CT results   Redell Burnet, MD 08/09/2023  [facility] [address], [address] [place], [place] [address] [phone]

## 2023-08-09 NOTE — Telephone Encounter (Signed)
-----   Message from Redell JAYSON Burnet sent at 08/09/2023  3:55 PM EDT ----- Urinalysis concerning for UTI, recommend Augmentin  875-125 twice daily x 5 days, will call with final culture results  Redell Burnet, MD 08/09/2023  ----- Message ----- From: Interface, [facility] Lab Results In Sent: 08/09/2023   1:36 PM EDT To: Redell JAYSON Burnet, MD

## 2023-08-15 LAB — CULTURE, URINE COMPREHENSIVE

## 2023-08-21 ENCOUNTER — Ambulatory Visit
Admission: RE | Admit: 2023-08-21 | Discharge: 2023-08-21 | Disposition: A | Discharge status: Home / Self Care | Source: Ambulatory Visit | Attending: Urology | Admitting: Urology

## 2023-08-21 DIAGNOSIS — R1032 Left lower quadrant pain: Secondary | ICD-10-CM | POA: Diagnosis not present

## 2023-08-21 DIAGNOSIS — R109 Unspecified abdominal pain: Secondary | ICD-10-CM | POA: Diagnosis not present

## 2023-08-22 LAB — MYCOPLASMA / UREAPLASMA CULTURE
Mycoplasma hominis Culture: NEGATIVE
Ureaplasma urealyticum: NEGATIVE

## 2023-08-24 NOTE — Telephone Encounter (Signed)
 Appt scheduled

## 2023-09-12 DIAGNOSIS — E039 Hypothyroidism, unspecified: Secondary | ICD-10-CM | POA: Diagnosis not present

## 2023-09-18 DIAGNOSIS — E1122 Type 2 diabetes mellitus with diabetic chronic kidney disease: Secondary | ICD-10-CM | POA: Diagnosis not present

## 2023-09-18 DIAGNOSIS — E7800 Pure hypercholesterolemia, unspecified: Secondary | ICD-10-CM | POA: Diagnosis not present

## 2023-09-18 DIAGNOSIS — E039 Hypothyroidism, unspecified: Secondary | ICD-10-CM | POA: Diagnosis not present

## 2023-09-18 DIAGNOSIS — M8580 Other specified disorders of bone density and structure, unspecified site: Secondary | ICD-10-CM | POA: Diagnosis not present

## 2023-10-22 ENCOUNTER — Encounter: Payer: Self-pay | Admitting: Internal Medicine

## 2023-10-22 ENCOUNTER — Ambulatory Visit: Admitting: Internal Medicine

## 2023-10-22 VITALS — BP 120/60 | HR 71 | Temp 98.2°F | Ht 66.0 in | Wt 139.4 lb

## 2023-10-22 DIAGNOSIS — R5383 Other fatigue: Secondary | ICD-10-CM

## 2023-10-22 DIAGNOSIS — Z23 Encounter for immunization: Secondary | ICD-10-CM | POA: Diagnosis not present

## 2023-10-22 DIAGNOSIS — K5909 Other constipation: Secondary | ICD-10-CM

## 2023-10-22 DIAGNOSIS — M791 Myalgia, unspecified site: Secondary | ICD-10-CM

## 2023-10-22 DIAGNOSIS — N1831 Chronic kidney disease, stage 3a: Secondary | ICD-10-CM

## 2023-10-22 DIAGNOSIS — E039 Hypothyroidism, unspecified: Secondary | ICD-10-CM | POA: Diagnosis not present

## 2023-10-22 DIAGNOSIS — E1169 Type 2 diabetes mellitus with other specified complication: Secondary | ICD-10-CM | POA: Diagnosis not present

## 2023-10-22 DIAGNOSIS — E1122 Type 2 diabetes mellitus with diabetic chronic kidney disease: Secondary | ICD-10-CM

## 2023-10-22 DIAGNOSIS — E785 Hyperlipidemia, unspecified: Secondary | ICD-10-CM | POA: Diagnosis not present

## 2023-10-22 DIAGNOSIS — R300 Dysuria: Secondary | ICD-10-CM

## 2023-10-22 DIAGNOSIS — Z7985 Long-term (current) use of injectable non-insulin antidiabetic drugs: Secondary | ICD-10-CM

## 2023-10-22 DIAGNOSIS — E119 Type 2 diabetes mellitus without complications: Secondary | ICD-10-CM

## 2023-10-22 DIAGNOSIS — N261 Atrophy of kidney (terminal): Secondary | ICD-10-CM

## 2023-10-22 MED ORDER — OZEMPIC (0.25 OR 0.5 MG/DOSE) 2 MG/1.5ML ~~LOC~~ SOPN: 0.2500 mg | PEN_INJECTOR | SUBCUTANEOUS | 2 refills | Status: DC

## 2023-10-22 NOTE — Assessment & Plan Note (Signed)
 Currently  taking 88 mcg 6 days per week.  Endocrinology managing

## 2023-10-22 NOTE — Assessment & Plan Note (Signed)
 Managed with Linzess, colace and Sennakot per GI.  Averaging 1-2 BMS per week

## 2023-10-22 NOTE — Progress Notes (Unsigned)
 Subjective:  Patient ID: Jennifer Herrera, female    DOB: Jul 02, 1955  Age: 68 y.o. MRN: [id number]  CC: The primary encounter diagnosis was Hypothyroidism, unspecified type. Diagnoses of Hyperlipidemia associated with type 2 diabetes mellitus (HCC), Type 2 diabetes mellitus with stage 2 chronic kidney disease, without long-term current use of insulin  (HCC), and Other fatigue were also pertinent to this visit.   HPI Jennifer Herrera presents for  Chief Complaint  Patient presents with   Medical Management of Chronic Issues    6 mo f/u   1)    Outpatient Medications Prior to Visit  Medication Sig Dispense Refill   Cholecalciferol (VITAMIN D -3) 125 MCG (5000 UT) TABS Take 1 tablet by mouth daily.     Docusate Sodium  (COLACE PO) Take by mouth daily.     Dulaglutide  (TRULICITY ) 3 MG/0.5ML SOAJ Inject 1 pen (3 mg) into skin every 7 days for Diabetes  (Dx: e11.29) 6 mL 3   estradiol  (ESTRACE ) 0.1 MG/GM vaginal cream Place vaginally.     levothyroxine  (SYNTHROID ) 88 MCG tablet Take  1 tablet  Daily except take 1/2 tab on Mon/Thurs. Take on an empty stomach with only water for 30 minutes & no Antacid meds, Calcium  or Magnesium for 4 hours & avoid Biotin 90 tablet 3   LINZESS 290 MCG CAPS capsule Take 290 mcg by mouth daily.     rosuvastatin  (CRESTOR ) 10 MG tablet Take 1 tablet (10 mg total) by mouth 2 (two) times a week. 24 tablet 0   SENNA PO Take by mouth 2 (two) times daily.     No facility-administered medications prior to visit.    Review of Systems;  Patient denies headache, fevers, malaise, unintentional weight loss, skin rash, eye pain, sinus congestion and sinus pain, sore throat, dysphagia,  hemoptysis , cough, dyspnea, wheezing, chest pain, palpitations, orthopnea, edema, abdominal pain, nausea, melena, diarrhea, constipation, flank pain, dysuria, hematuria, urinary  Frequency, nocturia, numbness, tingling, seizures,  Focal weakness, Loss of consciousness,  Tremor, insomnia,  depression, anxiety, and suicidal ideation.      Objective:  BP 120/60   Pulse 71   Temp 98.2 F (36.8 C) (Oral)   Ht 5' 6 (1.676 m)   Wt 139 lb 6.4 oz (63.2 kg)   SpO2 98%   BMI 22.50 kg/m   BP Readings from Last 3 Encounters:  10/22/23 120/60  08/09/23 104/66  04/18/23 112/68    Wt Readings from Last 3 Encounters:  10/22/23 139 lb 6.4 oz (63.2 kg)  08/09/23 138 lb (62.6 kg)  04/18/23 141 lb 6.4 oz (64.1 kg)    Physical Exam  Lab Results  Component Value Date   HGBA1C 5.8 04/18/2023   HGBA1C 5.5 06/05/2022   HGBA1C 5.8 (H) 01/10/2022    Lab Results  Component Value Date   CREATININE 1.05 04/18/2023   CREATININE 1.26 (H) 06/05/2022   CREATININE 1.39 (H) 01/10/2022    Lab Results  Component Value Date   WBC 7.8 06/05/2022   HGB 13.2 06/05/2022   HCT 38.7 06/05/2022   PLT 295 06/05/2022   GLUCOSE 90 04/18/2023   CHOL 163 04/18/2023   TRIG 138.0 04/18/2023   HDL 43.90 04/18/2023   LDLCALC 91 04/18/2023   ALT 14 04/18/2023   AST 16 04/18/2023   NA 136 04/18/2023   K 4.6 04/18/2023   CL 100 04/18/2023   CREATININE 1.05 04/18/2023   BUN 15 04/18/2023   CO2 29 04/18/2023   TSH 7.14 (H) 11/09/2021  HGBA1C 5.8 04/18/2023   MICROALBUR -0.1 (L) 04/18/2023    CT RENAL STONE STUDY Result Date: 08/22/2023 CLINICAL DATA:  Abdominal/flank pain EXAM: CT ABDOMEN AND PELVIS WITHOUT CONTRAST TECHNIQUE: Multidetector CT imaging of the abdomen and pelvis was performed following the standard protocol without IV contrast. RADIATION DOSE REDUCTION: This exam was performed according to the departmental dose-optimization program which includes automated exposure control, adjustment of the mA and/or kV according to patient size and/or use of iterative reconstruction technique. COMPARISON:  CT of the abdomen pelvis performed September 20, 2021 FINDINGS: Lower chest: No acute abnormality. Unchanged, left lateral pulmonary nodule measuring 8 mm. Hepatobiliary: No focal liver  abnormality is seen. No gallstones, gallbladder wall thickening, or biliary dilatation. Pancreas: Unremarkable. No pancreatic ductal dilatation or surrounding inflammatory changes. Spleen: Normal in size without focal abnormality. Adrenals/Urinary Tract: The adrenal glands are within normal limits. Right kidney: No hydronephrosis or significant nephrolithiasis. The ureters grossly unremarkable. Left kidney: Left kidney is atrophic with cortical thinning, measuring 6.8 cm. No hydronephrosis or significant nephrolithiasis. Urinary bladder is decompressed. Stomach/Bowel: Large volume of stool material is present throughout the colon. The appendix is within normal limits. Vascular/Lymphatic: No abdominal aortic aneurysm. No significant lymphadenopathy. Reproductive: Uterus and bilateral adnexa are unremarkable. Other: Nothing significant. Musculoskeletal: No acute or significant osseous findings. IMPRESSION: 1. No evidence of hydronephrosis or significant nephrolithiasis. 2. Atrophy and cortical thinning of the left kidney. Electronically Signed   By: Maude Naegeli M.D.   On: 08/22/2023 12:05    Assessment & Plan:  .Hypothyroidism, unspecified type  Hyperlipidemia associated with type 2 diabetes mellitus (HCC)  Type 2 diabetes mellitus with stage 2 chronic kidney disease, without long-term current use of insulin  (HCC)  Other fatigue     I spent 34 minutes on the day of this face to face encounter reviewing patient's  most recent visit with cardiology,  nephrology,  and neurology,  prior relevant surgical and non surgical procedures, recent  labs and imaging studies, counseling on weight management,  reviewing the assessment and plan with patient, and post visit ordering and reviewing of  diagnostics and therapeutics with patient  .   Follow-up: No follow-ups on file.   Verneita LITTIE Kettering, MD

## 2023-10-22 NOTE — Assessment & Plan Note (Addendum)
 A1c is at goal with Trulicity  , however she has stopped taking  Crestor  for primary prevention because of knee pain which she attributes to statin. Will   recommend trial of zetia if LDL is > 70.    Lab Results  Component Value Date   HGBA1C 5.8 04/18/2023   Lab Results  Component Value Date   CHOL 163 04/18/2023   HDL 43.90 04/18/2023   LDLCALC 91 04/18/2023   TRIG 138.0 04/18/2023   CHOLHDL 4 04/18/2023

## 2023-10-22 NOTE — Assessment & Plan Note (Signed)
 Seeing [facility]

## 2023-10-22 NOTE — Assessment & Plan Note (Signed)
 Etiology unclear despite nephrology evaluation.  She has one functioning kidney no proteinuria  due to history  of hydronephrosis in 2023 secondary to ureteral calculi,   Advised to avoid all NSAIDs.  Follow up with nephrology in 6  months

## 2023-10-22 NOTE — Patient Instructions (Signed)
 Statin therapy is now considered standard of care for primary prevention of heart disease in all patients who have a diagnosis of type 2 diabetes. If your LDL is > 70 today, I will recommend a trial of Zetia , as  you may tolerate it better than the statins that you did not tolerate previously.  It works by inhibiting the absorption of cholesterol by the small intestine, so it lowers LDL .   The ozempic may not be covered as well as Trulicity  by your insurance.  When the time comes to fill it, if the cost is prohibitive, let me know and I will prescribe the higher dose pen that can be manipulated into delivering smaller doses

## 2023-10-22 NOTE — Assessment & Plan Note (Signed)
 Crestor  was not tolerated despite every other day dosing due to increased leg pain

## 2023-10-23 ENCOUNTER — Ambulatory Visit: Payer: Self-pay | Admitting: Internal Medicine

## 2023-10-23 LAB — LIPID PANEL
Cholesterol: 196 mg/dL (ref 0–200)
HDL: 49.5 mg/dL (ref >39.00)
LDL Cholesterol: 122 mg/dL — ABNORMAL HIGH (ref 0–99)
NonHDL: 146.32
Total CHOL/HDL Ratio: 4
Triglycerides: 122 mg/dL (ref 0.0–149.0)
VLDL: 24.4 mg/dL (ref 0.0–40.0)

## 2023-10-23 LAB — CBC WITH DIFFERENTIAL/PLATELET
Basophils Absolute: 0.1 K/uL (ref 0.0–0.1)
Basophils Relative: 1.6 % (ref 0.0–3.0)
Eosinophils Absolute: 0.1 K/uL (ref 0.0–0.7)
Eosinophils Relative: 1.1 % (ref 0.0–5.0)
HCT: 42.5 % (ref 36.0–46.0)
Hemoglobin: 14.2 g/dL (ref 12.0–15.0)
Lymphocytes Relative: 37.8 % (ref 12.0–46.0)
Lymphs Abs: 2.4 K/uL (ref 0.7–4.0)
MCHC: 33.4 g/dL (ref 30.0–36.0)
MCV: 94.9 fl (ref 78.0–100.0)
Monocytes Absolute: 0.6 K/uL (ref 0.1–1.0)
Monocytes Relative: 9.2 % (ref 3.0–12.0)
Neutro Abs: 3.2 K/uL (ref 1.4–7.7)
Neutrophils Relative %: 50.3 % (ref 43.0–77.0)
Platelets: 289 K/uL (ref 150.0–400.0)
RBC: 4.48 Mil/uL (ref 3.87–5.11)
RDW: 13.1 % (ref 11.5–15.5)
WBC: 6.4 K/uL (ref 4.0–10.5)

## 2023-10-23 LAB — COMPREHENSIVE METABOLIC PANEL WITH GFR
ALT: 10 U/L (ref 0–35)
AST: 15 U/L (ref 0–37)
Albumin: 4.4 g/dL (ref 3.5–5.2)
Alkaline Phosphatase: 73 U/L (ref 39–117)
BUN: 13 mg/dL (ref 6–23)
CO2: 28 meq/L (ref 19–32)
Calcium: 9.7 mg/dL (ref 8.4–10.5)
Chloride: 102 meq/L (ref 96–112)
Creatinine, Ser: 1.07 mg/dL (ref 0.40–1.20)
GFR: 53.13 mL/min — ABNORMAL LOW (ref >60.00)
Glucose, Bld: 85 mg/dL (ref 70–99)
Potassium: 4.6 meq/L (ref 3.5–5.1)
Sodium: 137 meq/L (ref 135–145)
Total Bilirubin: 0.8 mg/dL (ref 0.2–1.2)
Total Protein: 7.1 g/dL (ref 6.0–8.3)

## 2023-10-23 LAB — LDL CHOLESTEROL, DIRECT: Direct LDL: 136 mg/dL

## 2023-10-23 LAB — HEMOGLOBIN A1C: Hgb A1c MFr Bld: 5.7 % (ref 4.6–6.5)

## 2023-10-23 MED ORDER — EZETIMIBE 10 MG PO TABS: 10.0000 mg | ORAL_TABLET | Freq: Every day | ORAL | 0 refills | Status: DC

## 2023-10-23 NOTE — Assessment & Plan Note (Signed)
 changing Trulicity  to Ozempic.

## 2023-10-31 DIAGNOSIS — E039 Hypothyroidism, unspecified: Secondary | ICD-10-CM | POA: Diagnosis not present

## 2023-11-19 ENCOUNTER — Encounter: Payer: Self-pay | Admitting: Internal Medicine

## 2023-11-21 MED ORDER — SEMAGLUTIDE (1 MG/DOSE) 4 MG/3ML ~~LOC~~ SOPN: 1.0000 mg | PEN_INJECTOR | SUBCUTANEOUS | 2 refills | Status: DC

## 2023-11-26 ENCOUNTER — Ambulatory Visit: Admitting: Physician Assistant

## 2023-11-26 ENCOUNTER — Encounter: Payer: Self-pay | Admitting: Physician Assistant

## 2023-11-26 VITALS — BP 90/61 | HR 78 | Ht 66.0 in | Wt 133.0 lb

## 2023-11-26 DIAGNOSIS — N390 Urinary tract infection, site not specified: Secondary | ICD-10-CM

## 2023-11-26 DIAGNOSIS — N261 Atrophy of kidney (terminal): Secondary | ICD-10-CM | POA: Diagnosis not present

## 2023-11-26 DIAGNOSIS — K080 Exfoliation of teeth due to systemic causes: Secondary | ICD-10-CM | POA: Diagnosis not present

## 2023-11-26 DIAGNOSIS — M19011 Primary osteoarthritis, right shoulder: Secondary | ICD-10-CM | POA: Diagnosis not present

## 2023-11-26 NOTE — Progress Notes (Signed)
 11/26/2023 9:59 AM   Jennifer Herrera [birth year] [id number]  CC: Chief Complaint  Patient presents with   Recurrent UTI   HPI: Jennifer Herrera is a 68 y.o. female with PMH recurrent UTI, left renal atrophy, CKD 3A, and nephrolithiasis who presents today for follow-up.   She had a CT stone study on 08/21/2023 that showed no hydronephrosis or significant nephrolithiasis.  There was redemonstrated left renal atrophy.  Today she reports no acute concerns.  She has been taking an unknown antibiotic with sex and it has been working well for UTI prevention.  PMH: Past Medical History:  Diagnosis Date   Allergy 1962   Sulfa rash   Diabetes mellitus without complication (HCC) 2010   HLD (hyperlipidemia) 11/20/2018   Hypothyroidism, adult 11/20/2018   IBS (irritable bowel syndrome)    Prediabetes 11/20/2018   Thyroid  disease    Vitamin D  deficiency disease 11/20/2018    Surgical History: Past Surgical History:  Procedure Laterality Date   ANAL RECTAL MANOMETRY N/A 08/15/2019   Procedure: ANO RECTAL MANOMETRY;  Surgeon: Dianna Specking, MD;  Location: [facility];  Service: Endoscopy;  Laterality: N/A;   AUGMENTATION MAMMAPLASTY Bilateral 1979   saline   BREAST ENHANCEMENT SURGERY     COSMETIC SURGERY  1979   Multiple operations   FACIAL COSMETIC SURGERY     SHOULDER ARTHROSCOPY     TONSILLECTOMY     TUBAL LIGATION  1993    Home Medications:  Allergies as of 11/26/2023       Reactions   Sulfa Antibiotics Rash   Sulfasalazine Rash        Medication List        Accurate as of November 26, 2023  9:59 AM. If you have any questions, ask your nurse or doctor.          COLACE PO Take by mouth daily.   estradiol  0.1 MG/GM vaginal cream Commonly known as: ESTRACE  Place vaginally.   ezetimibe 10 MG tablet Commonly known as: Zetia Take 1 tablet (10 mg total) by mouth daily.   levothyroxine  88 MCG tablet Commonly known as: Synthroid  Take  1 tablet  Daily  except take 1/2 tab on Mon/Thurs. Take on an empty stomach with only water for 30 minutes & no Antacid meds, Calcium  or Magnesium for 4 hours & avoid Biotin What changed:  how much to take how to take this additional instructions   Linzess 290 MCG Caps capsule Generic drug: linaclotide Take 290 mcg by mouth daily.   ondansetron  8 MG tablet Commonly known as: ZOFRAN  TAKE 1/2 TO 1 TABLET BY MOUTH THREE TIMES DAILY AS NEEDED FOR NAUSEA   rosuvastatin  10 MG tablet Commonly known as: CRESTOR  Take 1 tablet (10 mg total) by mouth 2 (two) times a week.   Semaglutide (1 MG/DOSE) 4 MG/3ML Sopn Inject 1 mg as directed once a week.   SENNA PO Take by mouth 2 (two) times daily.   topiramate  50 MG tablet Commonly known as: TOPAMAX    Trulicity  3 MG/0.5ML Soaj Generic drug: Dulaglutide  INJECT 3 MG UNDER THE SKIN EVERY 7 DAYS FOR DIABETES   Vitamin D -3 125 MCG (5000 UT) Tabs Take 1 tablet by mouth daily.        Allergies:  Allergies  Allergen Reactions   Sulfa Antibiotics Rash   Sulfasalazine Rash    Family History: Family History  Problem Relation Age of Onset   Cancer Mother    Hypertension Mother    Cirrhosis Father  Cancer Sister    Alcohol abuse Brother    Diabetes Brother    Hypertension Brother    Diabetes Maternal Aunt     Social History:   reports that she has never smoked. She has never used smokeless tobacco. She reports current alcohol use of about 2.0 standard drinks of alcohol per week. She reports that she does not use drugs.  Physical Exam: BP 90/61   Pulse 78   Ht 5' 6 (1.676 m)   Wt 133 lb (60.3 kg)   BMI 21.47 kg/m   Constitutional:  Alert and oriented, no acute distress, nontoxic appearing HEENT: [place], AT Cardiovascular: No clubbing, cyanosis, or edema Respiratory: Normal respiratory effort, no increased work of breathing Skin: No rashes, bruises or suspicious lesions Neurologic: Grossly intact, no focal deficits, moving all 4  extremities Psychiatric: Normal mood and affect  Pertinent Imaging: Results for orders placed during the hospital encounter of 08/21/23  CT RENAL STONE STUDY  Narrative CLINICAL DATA:  Abdominal/flank pain  EXAM: CT ABDOMEN AND PELVIS WITHOUT CONTRAST  TECHNIQUE: Multidetector CT imaging of the abdomen and pelvis was performed following the standard protocol without IV contrast.  RADIATION DOSE REDUCTION: This exam was performed according to the departmental dose-optimization program which includes automated exposure control, adjustment of the mA and/or kV according to patient size and/or use of iterative reconstruction technique.  COMPARISON:  CT of the abdomen pelvis performed September 20, 2021  FINDINGS: Lower chest: No acute abnormality. Unchanged, left lateral pulmonary nodule measuring 8 mm.  Hepatobiliary: No focal liver abnormality is seen. No gallstones, gallbladder wall thickening, or biliary dilatation.  Pancreas: Unremarkable. No pancreatic ductal dilatation or surrounding inflammatory changes.  Spleen: Normal in size without focal abnormality.  Adrenals/Urinary Tract: The adrenal glands are within normal limits.  Right kidney: No hydronephrosis or significant nephrolithiasis. The ureters grossly unremarkable.  Left kidney: Left kidney is atrophic with cortical thinning, measuring 6.8 cm. No hydronephrosis or significant nephrolithiasis.  Urinary bladder is decompressed.  Stomach/Bowel: Large volume of stool material is present throughout the colon. The appendix is within normal limits.  Vascular/Lymphatic: No abdominal aortic aneurysm. No significant lymphadenopathy.  Reproductive: Uterus and bilateral adnexa are unremarkable.  Other: Nothing significant.  Musculoskeletal: No acute or significant osseous findings.  IMPRESSION: 1. No evidence of hydronephrosis or significant nephrolithiasis. 2. Atrophy and cortical thinning of the left  kidney.   Electronically Signed By: Maude Naegeli M.D. On: 08/22/2023 12:05   I personally reviewed the images referenced above and note left renal atrophy, no nephrolithiasis, no hydronephrosis.  Assessment & Plan:   1. Recurrent UTI (Primary) Well-controlled on coital antibiotics, asymptomatic today.  Will refill as needed.  2. Left renal atrophy Unclear etiology, though she does have a past history of stones.  No evidence of obstruction at this time that would warrant further intervention.  Return in about 1 year (around 11/25/2024) for Annual rUTI follow up.  Lucie Hones, PA-C  [facility] [employer] [address], [address] [place], [place] [address] [phone]

## 2023-12-14 DIAGNOSIS — E039 Hypothyroidism, unspecified: Secondary | ICD-10-CM | POA: Diagnosis not present

## 2024-01-09 ENCOUNTER — Ambulatory Visit

## 2024-01-09 VITALS — Ht 66.0 in | Wt 129.0 lb

## 2024-01-09 DIAGNOSIS — Z0000 Encounter for general adult medical examination without abnormal findings: Secondary | ICD-10-CM

## 2024-01-09 NOTE — Patient Instructions (Signed)
 Jennifer Herrera,  Thank you for taking the time for your Medicare Wellness Visit. I appreciate your continued commitment to your health goals. Please review the care plan we discussed, and feel free to reach out if I can assist you further.  Please note that Annual Wellness Visits do not include a physical exam. Some assessments may be limited, especially if the visit was conducted virtually. If needed, we may recommend an in-person follow-up with your provider.  Ongoing Care Seeing your primary care provider every 3 to 6 months helps us  monitor your health and provide consistent, personalized care.  Remember to keep your eye appointment and make sure they send the notes to our office.  Make sure the results of your mammogram scheduled 02/20/24 are sent to the office.   Referrals If a referral was made during today's visit and you haven't received any updates within two weeks, please contact the referred provider directly to check on the status.  Recommended Screenings:  Health Maintenance  Topic Date Due   Complete foot exam   01/11/2023   Breast Cancer Screening  08/24/2023   COVID-19 Vaccine (4 - 2025-26 season) 09/24/2023   Eye exam for diabetics  11/21/2023   Hemoglobin A1C  04/20/2024   Yearly kidney health urinalysis for diabetes  06/03/2024   Yearly kidney function blood test for diabetes  10/21/2024   Colon Cancer Screening  11/19/2024   Medicare Annual Wellness Visit  01/08/2025   DTaP/Tdap/Td vaccine (2 - Td or Tdap) 11/19/2028   Pneumococcal Vaccine for age over 32  Completed   Flu Shot  Completed   Osteoporosis screening with Bone Density Scan  Completed   Hepatitis C Screening  Completed   Zoster (Shingles) Vaccine  Completed   Meningitis B Vaccine  Aged Out       01/09/2024    1:48 PM  Advanced Directives  Does Patient Have a Medical Advance Directive? Yes  Type of Estate Agent of [place];Living will  Does patient want to make changes to medical  advance directive? No - Patient declined  Copy of Healthcare Power of Attorney in Chart? No - copy requested    Vision: Annual vision screenings are recommended for early detection of glaucoma, cataracts, and diabetic retinopathy. These exams can also reveal signs of chronic conditions such as diabetes and high blood pressure.  Dental: Annual dental screenings help detect early signs of oral cancer, gum disease, and other conditions linked to overall health, including heart disease and diabetes.  Please see the attached documents for additional preventive care recommendations.

## 2024-01-09 NOTE — Progress Notes (Cosign Needed Addendum)
 Chief Complaint  Patient presents with   Medicare Wellness     Subjective:   Jennifer Herrera is a 68 y.o. female who presents for a Medicare Annual Wellness Visit.  Visit info / Clinical Intake: Medicare Wellness Visit Type:: Subsequent Annual Wellness Visit Persons participating in visit and providing information:: patient Medicare Wellness Visit Mode:: Telephone If telephone:: video declined Since this visit was completed virtually, some vitals may be partially provided or unavailable. Missing vitals are due to the limitations of the virtual format.: Unable to obtain vitals - no equipment If Telephone or Video please confirm:: I connected with patient using audio/video enable telemedicine. I verified patient identity with two identifiers, discussed telehealth limitations, and patient agreed to proceed. Patient Location:: Home Provider Location:: Office/Home Interpreter Needed?: No Pre-visit prep was completed: yes AWV questionnaire completed by patient prior to visit?: no Living arrangements:: lives with spouse/significant other Patient's Overall Health Status Rating: very good Typical amount of pain: none Does pain affect daily life?: no Are you currently prescribed opioids?: no  Dietary Habits and Nutritional Risks How many meals a day?: 3 Eats fruit and vegetables daily?: yes Most meals are obtained by: preparing own meals In the last 2 weeks, have you had any of the following?: none Diabetic:: (!) yes Any non-healing wounds?: no How often do you check your BS?: 0 Would you like to be referred to a Nutritionist or for Diabetic Management? : no  Functional Status Activities of Daily Living (to include ambulation/medication): Independent Ambulation: Independent Medication Administration: Independent Home Management (perform basic housework or laundry): Independent Manage your own finances?: yes Primary transportation is: driving Concerns about vision?: no *vision  screening is required for WTM* Concerns about hearing?: (!) yes Uses hearing aids?: (!) yes  Fall Screening Falls in the past year?: 0 Number of falls in past year: 0 Was there an injury with Fall?: 0 Fall Risk Category Calculator: 0 Patient Fall Risk Level: Low Fall Risk  Fall Risk Patient at Risk for Falls Due to: No Fall Risks Fall risk Follow up: Falls evaluation completed; Falls prevention discussed  Home and Transportation Safety: All rugs have non-skid backing?: yes All stairs or steps have railings?: yes Grab bars in the bathtub or shower?: (!) no Have non-skid surface in bathtub or shower?: (!) no Good home lighting?: yes Regular seat belt use?: yes Hospital stays in the last year:: no  Cognitive Assessment Difficulty concentrating, remembering, or making decisions? : no Will 6CIT or Mini Cog be Completed: yes What year is it?: 0 points What month is it?: 0 points Give patient an address phrase to remember (5 components): [address] [place] About what time is it?: 0 points Count backwards from 20 to 1: 0 points Say the months of the year in reverse: 0 points Repeat the address phrase from earlier: 0 points 6 CIT Score: 0 points  Advance Directives (For Healthcare) Does Patient Have a Medical Advance Directive?: Yes Does patient want to make changes to medical advance directive?: No - Patient declined Type of Advance Directive: Healthcare Power of [place]; Living will Copy of Healthcare Power of Attorney in Chart?: No - copy requested Copy of Living Will in Chart?: No - copy requested  Reviewed/Updated  Reviewed/Updated: Reviewed All (Medical, Surgical, Family, Medications, Allergies, Care Teams, Patient Goals)    Allergies (verified) Sulfa antibiotics and Sulfasalazine   Current Medications (verified) Outpatient Encounter Medications as of 01/09/2024  Medication Sig   Cholecalciferol (VITAMIN D -3) 125 MCG (5000  UT) TABS Take 1 tablet by mouth  daily.   Docusate Sodium  (COLACE PO) Take by mouth daily.   estradiol  (ESTRACE ) 0.1 MG/GM vaginal cream Place vaginally. (Patient taking differently: Place vaginally once a week.)   ezetimibe  (ZETIA ) 10 MG tablet Take 1 tablet (10 mg total) by mouth daily.   levothyroxine  (SYNTHROID ) 75 MCG tablet Take 75 mcg by mouth daily before breakfast.   LINZESS 290 MCG CAPS capsule Take 290 mcg by mouth daily.   ondansetron  (ZOFRAN ) 8 MG tablet TAKE 1/2 TO 1 TABLET BY MOUTH THREE TIMES DAILY AS NEEDED FOR NAUSEA   Semaglutide , 1 MG/DOSE, 4 MG/3ML SOPN Inject 1 mg as directed once a week.   SENNA PO Take by mouth 2 (two) times daily.   Dulaglutide  (TRULICITY ) 3 MG/0.5ML SOAJ INJECT 3 MG UNDER THE SKIN EVERY 7 DAYS FOR DIABETES (Patient not taking: Reported on 01/09/2024)   rosuvastatin  (CRESTOR ) 10 MG tablet Take 1 tablet (10 mg total) by mouth 2 (two) times a week. (Patient not taking: Reported on 01/09/2024)   topiramate  (TOPAMAX ) 50 MG tablet  (Patient not taking: Reported on 01/09/2024)   [DISCONTINUED] levothyroxine  (SYNTHROID ) 88 MCG tablet Take  1 tablet  Daily except take 1/2 tab on Mon/Thurs. Take on an empty stomach with only water for 30 minutes & no Antacid meds, Calcium  or Magnesium for 4 hours & avoid Biotin (Patient not taking: Reported on 01/09/2024)   No facility-administered encounter medications on file as of 01/09/2024.    History: Past Medical History:  Diagnosis Date   Allergy 1962   Sulfa rash   Diabetes mellitus without complication (HCC) 2010   HLD (hyperlipidemia) 11/20/2018   Hypothyroidism, adult 11/20/2018   IBS (irritable bowel syndrome)    Prediabetes 11/20/2018   Thyroid  disease    Vitamin D  deficiency disease 11/20/2018   Past Surgical History:  Procedure Laterality Date   ANAL RECTAL MANOMETRY N/A 08/15/2019   Procedure: ANO RECTAL MANOMETRY;  Surgeon: Dianna Specking, MD;  Location: [facility];  Service: Endoscopy;  Laterality: N/A;   AUGMENTATION  MAMMAPLASTY Bilateral 1979   saline   BREAST ENHANCEMENT SURGERY     COSMETIC SURGERY  1979   Multiple operations   FACIAL COSMETIC SURGERY     SHOULDER ARTHROSCOPY     TONSILLECTOMY     TUBAL LIGATION  1993   Family History  Problem Relation Age of Onset   Cancer Mother    Hypertension Mother    Cirrhosis Father    Cancer Sister    Alcohol abuse Brother    Diabetes Brother    Hypertension Brother    Diabetes Maternal Aunt    Social History   Occupational History   Not on file  Tobacco Use   Smoking status: Never   Smokeless tobacco: Never  Vaping Use   Vaping status: Never Used  Substance and Sexual Activity   Alcohol use: Yes    Alcohol/week: 2.0 standard drinks of alcohol    Types: 2 Glasses of wine per week    Comment: once weekly   Drug use: No   Sexual activity: Yes    Birth control/protection: Post-menopausal   Tobacco Counseling Counseling given: Not Answered  SDOH Screenings   Food Insecurity: No Food Insecurity (01/09/2024)  Housing: Low Risk (01/09/2024)  Transportation Needs: No Transportation Needs (01/09/2024)  Utilities: Not At Risk (01/09/2024)  Alcohol Screen: Low Risk (01/09/2024)  Depression (PHQ2-9): Low Risk (01/09/2024)  Financial Resource Strain: Low Risk (01/09/2024)  Physical Activity: Sufficiently Active (  01/09/2024)  Social Connections: Moderately Integrated (01/09/2024)  Stress: No Stress Concern Present (01/09/2024)  Tobacco Use: Low Risk (01/09/2024)  Health Literacy: Adequate Health Literacy (01/09/2024)   See flowsheets for full screening details  Depression Screen PHQ 2 & 9 Depression Scale- Over the past 2 weeks, how often have you been bothered by any of the following problems? Little interest or pleasure in doing things: 0 Feeling down, depressed, or hopeless (PHQ Adolescent also includes...irritable): 0 PHQ-2 Total Score: 0 Trouble falling or staying asleep, or sleeping too much: 0 Feeling tired or having little  energy: 0 Poor appetite or overeating (PHQ Adolescent also includes...weight loss): 0 Feeling bad about yourself - or that you are a failure or have let yourself or your family down: 0 Trouble concentrating on things, such as reading the newspaper or watching television (PHQ Adolescent also includes...like school work): 0 Moving or speaking so slowly that other people could have noticed. Or the opposite - being so fidgety or restless that you have been moving around a lot more than usual: 0 Thoughts that you would be better off dead, or of hurting yourself in some way: 0 PHQ-9 Total Score: 0 If you checked off any problems, how difficult have these problems made it for you to do your work, take care of things at home, or get along with other people?: Not difficult at all     Goals Addressed             This Visit's Progress    Patient Stated       Wants to start exercising more             Objective:    Today's Vitals   01/09/24 1342  Weight: 129 lb (58.5 kg)  Height: 5' 6 (1.676 m)   Body mass index is 20.82 kg/m.  Hearing/Vision screen Hearing Screening - Comments:: Wears aids Vision Screening - Comments:: Readers, [facility], has appointment January 2026 Immunizations and Health Maintenance Health Maintenance  Topic Date Due   FOOT EXAM  01/11/2023   Mammogram  08/24/2023   COVID-19 Vaccine (4 - 2025-26 season) 09/24/2023   OPHTHALMOLOGY EXAM  11/21/2023   HEMOGLOBIN A1C  04/20/2024   Diabetic kidney evaluation - Urine ACR  06/03/2024   Diabetic kidney evaluation - eGFR measurement  10/21/2024   Colonoscopy  11/19/2024   Medicare Annual Wellness (AWV)  01/08/2025   DTaP/Tdap/Td (2 - Td or Tdap) 11/19/2028   Pneumococcal Vaccine: 50+ Years  Completed   Influenza Vaccine  Completed   Bone Density Scan  Completed   Hepatitis C Screening  Completed   Zoster Vaccines- Shingrix  Completed   Meningococcal B Vaccine  Aged Out         Assessment/Plan:  This is a routine wellness examination for Jennifer Herrera.  Patient Care Team: Marylynn Verneita CROME, MD as PCP - General (Internal Medicine) Francisca Redell BROCKS, MD as Consulting Physician (Urology) Rosalie Kitchens, MD as Consulting Physician (Gastroenterology)  I have personally reviewed and noted the following in the patients chart:   Medical and social history Use of alcohol, tobacco or illicit drugs  Current medications and supplements including opioid prescriptions. Functional ability and status Nutritional status Physical activity Advanced directives List of other physicians Hospitalizations, surgeries, and ER visits in previous 12 months Vitals Screenings to include cognitive, depression, and falls Referrals and appointments  No orders of the defined types were placed in this encounter.  In addition, I have reviewed and discussed with  patient certain preventive protocols, quality metrics, and best practice recommendations. A written personalized care plan for preventive services as well as general preventive health recommendations were provided to patient.   Angeline Fredericks, LPN   [id number]/82/[id number]   Return in 1 year (on 01/08/2025).  After Visit Summary: (MyChart) Due to this being a telephonic visit, the after visit summary with patients personalized plan was offered to patient via MyChart   Nurse Notes: Patient has a diabetic eye appointment scheduled January 2026  and will have office notes sent to the office. Patient has an appointment scheduled 02/20/24 for a mammogram at [facility]. Patient declines covid vaccine. Patient needs a diabetic foot exam completed and documented at next office visit.

## 2024-01-11 NOTE — Addendum Note (Signed)
 Addended by: HENRIETTA CORRIGAN C on: 01/11/2024 07:44 AM   Modules accepted: Orders, Level of Service

## 2024-01-11 NOTE — Progress Notes (Signed)
 "  Chief Complaint  Patient presents with   Medicare Wellness     Subjective:   Jennifer Herrera is a 68 y.o. female who presents for a Medicare Annual Wellness Visit.  Visit info / Clinical Intake: Medicare Wellness Visit Type:: Initial Annual Wellness Visit Persons participating in visit and providing information:: patient Medicare Wellness Visit Mode:: Telephone If telephone:: video declined Since this visit was completed virtually, some vitals may be partially provided or unavailable. Missing vitals are due to the limitations of the virtual format.: Unable to obtain vitals - no equipment If Telephone or Video please confirm:: I connected with patient using audio/video enable telemedicine. I verified patient identity with two identifiers, discussed telehealth limitations, and patient agreed to proceed. Patient Location:: Home Provider Location:: Office/Home Interpreter Needed?: No Pre-visit prep was completed: yes AWV questionnaire completed by patient prior to visit?: no Living arrangements:: lives with spouse/significant other Patient's Overall Health Status Rating: very good Typical amount of pain: none Does pain affect daily life?: no Are you currently prescribed opioids?: no  Dietary Habits and Nutritional Risks How many meals a day?: 3 Eats fruit and vegetables daily?: yes Most meals are obtained by: preparing own meals In the last 2 weeks, have you had any of the following?: none Diabetic:: (!) yes Any non-healing wounds?: no How often do you check your BS?: 0 Would you like to be referred to a Nutritionist or for Diabetic Management? : no  Functional Status Activities of Daily Living (to include ambulation/medication): Independent Ambulation: Independent Medication Administration: Independent Home Management (perform basic housework or laundry): Independent Manage your own finances?: yes Primary transportation is: driving Concerns about vision?: no *vision  screening is required for WTM* Concerns about hearing?: (!) yes Uses hearing aids?: (!) yes  Fall Screening Falls in the past year?: 0 Number of falls in past year: 0 Was there an injury with Fall?: 0 Fall Risk Category Calculator: 0 Patient Fall Risk Level: Low Fall Risk  Fall Risk Patient at Risk for Falls Due to: No Fall Risks Fall risk Follow up: Falls evaluation completed; Falls prevention discussed  Home and Transportation Safety: All rugs have non-skid backing?: yes All stairs or steps have railings?: yes Grab bars in the bathtub or shower?: (!) no Have non-skid surface in bathtub or shower?: (!) no Good home lighting?: yes Regular seat belt use?: yes Hospital stays in the last year:: no  Cognitive Assessment Difficulty concentrating, remembering, or making decisions? : no Will 6CIT or Mini Cog be Completed: yes What year is it?: 0 points What month is it?: 0 points Give patient an address phrase to remember (5 components): [address] [place] About what time is it?: 0 points Count backwards from 20 to 1: 0 points Say the months of the year in reverse: 0 points Repeat the address phrase from earlier: 0 points 6 CIT Score: 0 points  Advance Directives (For Healthcare) Does Patient Have a Medical Advance Directive?: Yes Does patient want to make changes to medical advance directive?: No - Patient declined Type of Advance Directive: Healthcare Power of [place]; Living will Copy of Healthcare Power of Attorney in Chart?: No - copy requested Copy of Living Will in Chart?: No - copy requested  Reviewed/Updated  Reviewed/Updated: Reviewed All (Medical, Surgical, Family, Medications, Allergies, Care Teams, Patient Goals)    Allergies (verified) Sulfa antibiotics and Sulfasalazine   Current Medications (verified) Outpatient Encounter Medications as of 01/09/2024  Medication Sig   Cholecalciferol (VITAMIN D -3) 125 MCG (  5000 UT) TABS Take 1 tablet by mouth  daily.   Docusate Sodium  (COLACE PO) Take by mouth daily.   estradiol  (ESTRACE ) 0.1 MG/GM vaginal cream Place vaginally. (Patient taking differently: Place vaginally once a week.)   ezetimibe  (ZETIA ) 10 MG tablet Take 1 tablet (10 mg total) by mouth daily.   levothyroxine  (SYNTHROID ) 75 MCG tablet Take 75 mcg by mouth daily before breakfast.   LINZESS 290 MCG CAPS capsule Take 290 mcg by mouth daily.   ondansetron  (ZOFRAN ) 8 MG tablet TAKE 1/2 TO 1 TABLET BY MOUTH THREE TIMES DAILY AS NEEDED FOR NAUSEA   Semaglutide , 1 MG/DOSE, 4 MG/3ML SOPN Inject 1 mg as directed once a week.   SENNA PO Take by mouth 2 (two) times daily.   Dulaglutide  (TRULICITY ) 3 MG/0.5ML SOAJ INJECT 3 MG UNDER THE SKIN EVERY 7 DAYS FOR DIABETES (Patient not taking: Reported on 01/09/2024)   rosuvastatin  (CRESTOR ) 10 MG tablet Take 1 tablet (10 mg total) by mouth 2 (two) times a week. (Patient not taking: Reported on 01/09/2024)   topiramate  (TOPAMAX ) 50 MG tablet  (Patient not taking: Reported on 01/09/2024)   [DISCONTINUED] levothyroxine  (SYNTHROID ) 88 MCG tablet Take  1 tablet  Daily except take 1/2 tab on Mon/Thurs. Take on an empty stomach with only water for 30 minutes & no Antacid meds, Calcium  or Magnesium for 4 hours & avoid Biotin (Patient not taking: Reported on 01/09/2024)   No facility-administered encounter medications on file as of 01/09/2024.    History: Past Medical History:  Diagnosis Date   Allergy 1962   Sulfa rash   Diabetes mellitus without complication (HCC) 2010   HLD (hyperlipidemia) 11/20/2018   Hypothyroidism, adult 11/20/2018   IBS (irritable bowel syndrome)    Prediabetes 11/20/2018   Thyroid  disease    Vitamin D  deficiency disease 11/20/2018   Past Surgical History:  Procedure Laterality Date   ANAL RECTAL MANOMETRY N/A 08/15/2019   Procedure: ANO RECTAL MANOMETRY;  Surgeon: Dianna Specking, MD;  Location: [facility];  Service: Endoscopy;  Laterality: N/A;   AUGMENTATION  MAMMAPLASTY Bilateral 1979   saline   BREAST ENHANCEMENT SURGERY     COSMETIC SURGERY  1979   Multiple operations   FACIAL COSMETIC SURGERY     SHOULDER ARTHROSCOPY     TONSILLECTOMY     TUBAL LIGATION  1993   Family History  Problem Relation Age of Onset   Cancer Mother    Hypertension Mother    Cirrhosis Father    Cancer Sister    Alcohol abuse Brother    Diabetes Brother    Hypertension Brother    Diabetes Maternal Aunt    Social History   Occupational History   Not on file  Tobacco Use   Smoking status: Never   Smokeless tobacco: Never  Vaping Use   Vaping status: Never Used  Substance and Sexual Activity   Alcohol use: Yes    Alcohol/week: 2.0 standard drinks of alcohol    Types: 2 Glasses of wine per week    Comment: once weekly   Drug use: No   Sexual activity: Yes    Birth control/protection: Post-menopausal   Tobacco Counseling Counseling given: Not Answered  SDOH Screenings   Food Insecurity: No Food Insecurity (01/09/2024)  Housing: Low Risk (01/09/2024)  Transportation Needs: No Transportation Needs (01/09/2024)  Utilities: Not At Risk (01/09/2024)  Alcohol Screen: Low Risk (01/09/2024)  Depression (PHQ2-9): Low Risk (01/09/2024)  Financial Resource Strain: Low Risk (01/09/2024)  Physical Activity: Sufficiently  Active (01/09/2024)  Social Connections: Moderately Integrated (01/09/2024)  Stress: No Stress Concern Present (01/09/2024)  Tobacco Use: Low Risk (01/09/2024)  Health Literacy: Adequate Health Literacy (01/09/2024)   See flowsheets for full screening details  Depression Screen PHQ 2 & 9 Depression Scale- Over the past 2 weeks, how often have you been bothered by any of the following problems? Little interest or pleasure in doing things: 0 Feeling down, depressed, or hopeless (PHQ Adolescent also includes...irritable): 0 PHQ-2 Total Score: 0 Trouble falling or staying asleep, or sleeping too much: 0 Feeling tired or having little  energy: 0 Poor appetite or overeating (PHQ Adolescent also includes...weight loss): 0 Feeling bad about yourself - or that you are a failure or have let yourself or your family down: 0 Trouble concentrating on things, such as reading the newspaper or watching television (PHQ Adolescent also includes...like school work): 0 Moving or speaking so slowly that other people could have noticed. Or the opposite - being so fidgety or restless that you have been moving around a lot more than usual: 0 Thoughts that you would be better off dead, or of hurting yourself in some way: 0 PHQ-9 Total Score: 0 If you checked off any problems, how difficult have these problems made it for you to do your work, take care of things at home, or get along with other people?: Not difficult at all     Goals Addressed             This Visit's Progress    Patient Stated       Wants to start exercising more             Objective:    Today's Vitals   01/09/24 1342  Weight: 129 lb (58.5 kg)  Height: 5' 6 (1.676 m)   Body mass index is 20.82 kg/m.  Hearing/Vision screen Hearing Screening - Comments:: Wears aids Vision Screening - Comments:: Readers, [facility], has appointment January 2026 Immunizations and Health Maintenance Health Maintenance  Topic Date Due   FOOT EXAM  01/11/2023   Mammogram  08/24/2023   COVID-19 Vaccine (4 - 2025-26 season) 09/24/2023   OPHTHALMOLOGY EXAM  11/21/2023   HEMOGLOBIN A1C  04/20/2024   Diabetic kidney evaluation - Urine ACR  06/03/2024   Diabetic kidney evaluation - eGFR measurement  10/21/2024   Colonoscopy  11/19/2024   Medicare Annual Wellness (AWV)  01/08/2025   DTaP/Tdap/Td (2 - Td or Tdap) 11/19/2028   Pneumococcal Vaccine: 50+ Years  Completed   Influenza Vaccine  Completed   Bone Density Scan  Completed   Hepatitis C Screening  Completed   Zoster Vaccines- Shingrix  Completed   Meningococcal B Vaccine  Aged Out         Assessment/Plan:  This is a routine wellness examination for Ambar.  Patient Care Team: Marylynn Verneita CROME, MD as PCP - General (Internal Medicine) Francisca Redell BROCKS, MD as Consulting Physician (Urology) Rosalie Kitchens, MD as Consulting Physician (Gastroenterology)  I have personally reviewed and noted the following in the patients chart:   Medical and social history Use of alcohol, tobacco or illicit drugs  Current medications and supplements including opioid prescriptions. Functional ability and status Nutritional status Physical activity Advanced directives List of other physicians Hospitalizations, surgeries, and ER visits in previous 12 months Vitals Screenings to include cognitive, depression, and falls Referrals and appointments  No orders of the defined types were placed in this encounter.  In addition, I have reviewed and discussed  with patient certain preventive protocols, quality metrics, and best practice recommendations. A written personalized care plan for preventive services as well as general preventive health recommendations were provided to patient.   Angeline Fredericks, LPN   [id number]/80/7974   Return in 1 year (on 01/08/2025).  After Visit Summary: (MyChart) Due to this being a telephonic visit, the after visit summary with patients personalized plan was offered to patient via MyChart   Nurse Notes: Patient has an upcoming eye appointment. Patient has an appointment scheduled for a mammogram 02/20/24. "

## 2024-01-19 ENCOUNTER — Other Ambulatory Visit: Payer: Self-pay | Admitting: Internal Medicine

## 2024-01-24 ENCOUNTER — Other Ambulatory Visit: Payer: Self-pay | Admitting: Internal Medicine

## 2024-02-06 ENCOUNTER — Other Ambulatory Visit (HOSPITAL_COMMUNITY): Payer: Self-pay

## 2024-02-15 ENCOUNTER — Telehealth: Payer: Self-pay

## 2024-02-15 ENCOUNTER — Encounter: Payer: Self-pay | Admitting: Internal Medicine

## 2024-02-15 NOTE — Telephone Encounter (Signed)
 Copied from CRM #[id number]. Topic: Clinical - Medication Prior Auth >> Feb 14, 2024  4:17 PM Viola F wrote: Reason for CRM: Patient called to follow up on prior authorization for the Semaglutide , 1 MG/DOSE, 4 MG/3ML SOPN - a message was sent 02/11/24 but the PA hasn't been initiated and she missed her injection this week. Please call her with an update at  [phone]

## 2024-02-18 ENCOUNTER — Encounter: Payer: Medicare Other | Admitting: Internal Medicine

## 2024-02-19 ENCOUNTER — Telehealth: Payer: Self-pay

## 2024-02-19 ENCOUNTER — Other Ambulatory Visit (HOSPITAL_COMMUNITY): Payer: Self-pay

## 2024-02-19 NOTE — Telephone Encounter (Signed)
 Pharmacy Patient Advocate Encounter   Received notification from Patient Advice Request messages that prior authorization for Ozempic  (1 MG/DOSE) 4MG /3ML pen-injectors is required/requested.   Insurance verification completed.   The patient is insured through [facility].   Per test claim: PA required; PA submitted to above mentioned insurance via Latent Key/confirmation #/EOC [id number] Status is pending

## 2024-02-19 NOTE — Telephone Encounter (Signed)
 Pharmacy Patient Advocate Encounter  Received notification from [facility] that Prior Authorization for Ozempic  4mg /48ml has been APPROVED from 02/19/24 to 02/18/25   PA #/Case ID/Reference #: [id number]  Approval letter indexed to media tab

## 2024-02-20 NOTE — Telephone Encounter (Signed)
 LMTCB. Please let pt know that her ozempic  has been approved.

## 2024-02-22 NOTE — Telephone Encounter (Signed)
 Pt is aware and gave a verbal understanding.

## 2024-02-22 NOTE — Telephone Encounter (Signed)
 Pt has been notified of approval.

## 2024-02-29 ENCOUNTER — Other Ambulatory Visit: Payer: Self-pay

## 2024-02-29 MED ORDER — SEMAGLUTIDE (1 MG/DOSE) 4 MG/3ML ~~LOC~~ SOPN: 1.0000 mg | PEN_INJECTOR | SUBCUTANEOUS | 2 refills | Status: AC

## 2024-03-04 ENCOUNTER — Encounter: Payer: Medicare Other | Admitting: Internal Medicine

## 2024-04-21 ENCOUNTER — Ambulatory Visit: Admitting: Internal Medicine

## 2024-11-25 ENCOUNTER — Ambulatory Visit: Admitting: Physician Assistant

## 2025-01-12 ENCOUNTER — Ambulatory Visit
